# Patient Record
Sex: Male | Born: 1935 | Race: White | Hispanic: No | Smoking: Former smoker
Health system: Southern US, Community
[De-identification: ages and names within clinical notes are randomized; demographics above are authoritative.]

## PROBLEM LIST (undated history)

## (undated) DIAGNOSIS — K449 Diaphragmatic hernia without obstruction or gangrene: Secondary | ICD-10-CM

## (undated) DIAGNOSIS — M1990 Unspecified osteoarthritis, unspecified site: Secondary | ICD-10-CM

## (undated) DIAGNOSIS — G839 Paralytic syndrome, unspecified: Secondary | ICD-10-CM

## (undated) DIAGNOSIS — K4090 Unilateral inguinal hernia, without obstruction or gangrene, not specified as recurrent: Secondary | ICD-10-CM

## (undated) DIAGNOSIS — K5792 Diverticulitis of intestine, part unspecified, without perforation or abscess without bleeding: Secondary | ICD-10-CM

## (undated) DIAGNOSIS — F0390 Unspecified dementia without behavioral disturbance: Secondary | ICD-10-CM

## (undated) DIAGNOSIS — K219 Gastro-esophageal reflux disease without esophagitis: Secondary | ICD-10-CM

## (undated) DIAGNOSIS — I10 Essential (primary) hypertension: Secondary | ICD-10-CM

## (undated) DIAGNOSIS — Z8781 Personal history of (healed) traumatic fracture: Secondary | ICD-10-CM

## (undated) DIAGNOSIS — K635 Polyp of colon: Secondary | ICD-10-CM

## (undated) DIAGNOSIS — R197 Diarrhea, unspecified: Secondary | ICD-10-CM

## (undated) DIAGNOSIS — W11XXXA Fall on and from ladder, initial encounter: Secondary | ICD-10-CM

## (undated) HISTORY — DX: Diaphragmatic hernia without obstruction or gangrene: K44.9

## (undated) HISTORY — DX: Polyp of colon: K63.5

## (undated) HISTORY — DX: Fall on and from ladder, initial encounter: W11.XXXA

## (undated) HISTORY — PX: CATARACT EXTRACTION, BILATERAL: SHX1313

## (undated) HISTORY — PX: TONSILLECTOMY: SUR1361

## (undated) HISTORY — PX: COLONOSCOPY: SHX174

## (undated) HISTORY — DX: Diarrhea, unspecified: R19.7

## (undated) HISTORY — DX: Gilbert syndrome: E80.4

## (undated) HISTORY — DX: Diverticulitis of intestine, part unspecified, without perforation or abscess without bleeding: K57.92

## (undated) HISTORY — PX: CHOLECYSTECTOMY: SHX55

## (undated) HISTORY — PX: UPPER GASTROINTESTINAL ENDOSCOPY: SHX188

## (undated) HISTORY — PX: HERNIA REPAIR: SHX51

## (undated) HISTORY — PX: EYE SURGERY: SHX253

---

## 1998-09-05 ENCOUNTER — Ambulatory Visit (HOSPITAL_COMMUNITY): Admission: RE | Admit: 1998-09-05 | Discharge: 1998-09-05 | Payer: Self-pay | Admitting: Gastroenterology

## 2003-01-07 ENCOUNTER — Encounter: Payer: Self-pay | Admitting: General Surgery

## 2003-01-13 ENCOUNTER — Encounter (INDEPENDENT_AMBULATORY_CARE_PROVIDER_SITE_OTHER): Payer: Self-pay | Admitting: Specialist

## 2003-01-13 ENCOUNTER — Ambulatory Visit (HOSPITAL_COMMUNITY): Admission: RE | Admit: 2003-01-13 | Discharge: 2003-01-13 | Payer: Self-pay | Admitting: General Surgery

## 2003-02-28 ENCOUNTER — Encounter: Admission: RE | Admit: 2003-02-28 | Discharge: 2003-02-28 | Payer: Self-pay | Admitting: Internal Medicine

## 2003-02-28 ENCOUNTER — Encounter: Payer: Self-pay | Admitting: Internal Medicine

## 2004-01-15 ENCOUNTER — Inpatient Hospital Stay (HOSPITAL_COMMUNITY): Admission: AD | Admit: 2004-01-15 | Discharge: 2004-01-16 | Payer: Self-pay | Admitting: Emergency Medicine

## 2004-02-29 ENCOUNTER — Ambulatory Visit (HOSPITAL_COMMUNITY): Admission: RE | Admit: 2004-02-29 | Discharge: 2004-02-29 | Payer: Self-pay | Admitting: Gastroenterology

## 2004-08-20 ENCOUNTER — Emergency Department (HOSPITAL_COMMUNITY): Admission: EM | Admit: 2004-08-20 | Discharge: 2004-08-20 | Payer: Self-pay | Admitting: Family Medicine

## 2006-12-15 ENCOUNTER — Encounter: Admission: RE | Admit: 2006-12-15 | Discharge: 2006-12-15 | Payer: Self-pay | Admitting: *Deleted

## 2008-02-29 ENCOUNTER — Emergency Department (HOSPITAL_COMMUNITY): Admission: EM | Admit: 2008-02-29 | Discharge: 2008-02-29 | Payer: Self-pay | Admitting: Emergency Medicine

## 2010-06-05 ENCOUNTER — Inpatient Hospital Stay (HOSPITAL_COMMUNITY): Admission: EM | Admit: 2010-06-05 | Discharge: 2010-06-09 | Payer: Self-pay | Admitting: Emergency Medicine

## 2010-06-05 ENCOUNTER — Emergency Department (HOSPITAL_COMMUNITY): Admission: EM | Admit: 2010-06-05 | Discharge: 2010-06-05 | Payer: Self-pay | Admitting: Family Medicine

## 2010-12-27 LAB — POCT I-STAT, CHEM 8
BUN: 17 mg/dL (ref 6–23)
Calcium, Ion: 0.96 mmol/L — ABNORMAL LOW (ref 1.12–1.32)
Chloride: 104 mEq/L (ref 96–112)
Creatinine, Ser: 0.9 mg/dL (ref 0.4–1.5)
Glucose, Bld: 132 mg/dL — ABNORMAL HIGH (ref 70–99)
HCT: 45 % (ref 39.0–52.0)
Hemoglobin: 15.3 g/dL (ref 13.0–17.0)
Potassium: 3.6 mEq/L (ref 3.5–5.1)
Sodium: 137 mEq/L (ref 135–145)
TCO2: 24 mmol/L (ref 0–100)

## 2010-12-27 LAB — BASIC METABOLIC PANEL
BUN: 10 mg/dL (ref 6–23)
BUN: 10 mg/dL (ref 6–23)
CO2: 33 mEq/L — ABNORMAL HIGH (ref 19–32)
CO2: 33 mEq/L — ABNORMAL HIGH (ref 19–32)
Calcium: 8.8 mg/dL (ref 8.4–10.5)
Calcium: 8.8 mg/dL (ref 8.4–10.5)
Calcium: 9 mg/dL (ref 8.4–10.5)
Chloride: 100 mEq/L (ref 96–112)
Chloride: 100 mEq/L (ref 96–112)
Creatinine, Ser: 0.68 mg/dL (ref 0.4–1.5)
Creatinine, Ser: 0.7 mg/dL (ref 0.4–1.5)
GFR calc Af Amer: >60 mL/min (ref >60)
GFR calc Af Amer: >60 mL/min (ref >60)
GFR calc Af Amer: >60 mL/min (ref >60)
GFR calc non Af Amer: >60 mL/min (ref >60)
GFR calc non Af Amer: >60 mL/min (ref >60)
GFR calc non Af Amer: >60 mL/min (ref >60)
Glucose, Bld: 147 mg/dL — ABNORMAL HIGH (ref 70–99)
Glucose, Bld: 153 mg/dL — ABNORMAL HIGH (ref 70–99)
Potassium: 3.7 mEq/L (ref 3.5–5.1)
Potassium: 3.8 mEq/L (ref 3.5–5.1)
Sodium: 136 mEq/L (ref 135–145)
Sodium: 136 mEq/L (ref 135–145)
Sodium: 137 mEq/L (ref 135–145)

## 2010-12-27 LAB — CBC
HCT: 38.8 % — ABNORMAL LOW (ref 39.0–52.0)
Hemoglobin: 13.5 g/dL (ref 13.0–17.0)
Hemoglobin: 13.8 g/dL (ref 13.0–17.0)
MCH: 31.7 pg (ref 26.0–34.0)
MCHC: 34.8 g/dL (ref 30.0–36.0)
MCHC: 35.6 g/dL (ref 30.0–36.0)
MCV: 89 fL (ref 78.0–100.0)
Platelets: 92 10*3/uL — ABNORMAL LOW (ref 150–400)
Platelets: 99 10*3/uL — ABNORMAL LOW (ref 150–400)
RBC: 4.36 MIL/uL (ref 4.22–5.81)
RBC: 4.37 MIL/uL (ref 4.22–5.81)
RDW: 12.4 % (ref 11.5–15.5)
RDW: 12.5 % (ref 11.5–15.5)
WBC: 7.7 10*3/uL (ref 4.0–10.5)

## 2010-12-27 LAB — GLUCOSE, CAPILLARY
Glucose-Capillary: 139 mg/dL — ABNORMAL HIGH (ref 70–99)
Glucose-Capillary: 150 mg/dL — ABNORMAL HIGH (ref 70–99)
Glucose-Capillary: 153 mg/dL — ABNORMAL HIGH (ref 70–99)

## 2010-12-27 LAB — HEMOGLOBIN A1C
Hgb A1c MFr Bld: 5.5 % (ref <5.7)
Mean Plasma Glucose: 111 mg/dL (ref <117)

## 2010-12-27 LAB — MRSA PCR SCREENING
MRSA by PCR: NEGATIVE
MRSA by PCR: NEGATIVE

## 2011-03-01 NOTE — Op Note (Signed)
NAME:  Brent Brennan, Brent Brennan                      ACCOUNT NO.:  [id number]   MEDICAL RECORD NO.:  [id number]                   PATIENT TYPE:  AMB   LOCATION:  DAY                                  FACILITY:  [facility]   PHYSICIAN:  Timothy E. Earlene Plater, M.D.              DATE OF BIRTH:  1930-12-28   DATE OF PROCEDURE:  01/13/2003  DATE OF DISCHARGE:                                 OPERATIVE REPORT   PREOPERATIVE DIAGNOSIS:  Right inguinal hernia.   POSTOPERATIVE DIAGNOSIS:  Right direct and indirect inguinal hernia.   PROCEDURE:  Repair of hernia with mesh.   SURGEON:  Timothy E. Earlene Plater, M.D.   ANESTHESIA:  General.   CLINICAL NOTE:  The patient is otherwise healthy, thin, and is active in his  retirement.  He has an enlarging right inguinal hernia and after careful  discussion, he wishes to have it repaired.  He was evaluated by anesthesia,  identified, the permit signed.   DESCRIPTION OF PROCEDURE:  He was taken to the operating room, placed  supine, general endotracheal anesthesia administered.  The right groin had  been shaved.  It was prepped and draped in the usual fashion.  Marcaine  0.25% with epinephrine was used throughout for additional anesthesia.  A  curvilinear incision made over the palpable defect in the right groin down  to subcutaneous tissue dissected, bleeding points cauterized.  External  oblique opened in line with the fibers of the external ring and the cord  structures dissected from the floor of the canal.  A moderate distinct  direct hernia was present in the floor.  Because it was still bulging, I  closed that with a running 0 Prolene.  Also a moderate indirect sac was  identified and dissected from the cord structures back to its neck at the  internal ring and was empty.  It was ligated at its neck with a Prolene,  excess sac cut away, the neck retracted through the internal ring.  A piece  of flat mesh was cut and fashioned to fit the entire floor of the canal  and  was sewn down with running 2-0 Prolene.  This included up to and around the  internal ring.  The cord was replaced in the anatomic position.  The counts  were correct.  The external oblique was closed with running 2-0 Vicryl, deep  subcu 2-0 Vicryl, skin 3-0 Monocryl.  Final counts correct.  He tolerated it  well.  Steri-Strips and dry sterile dressing applied, and he was removed to  the recovery room in good condition.   Written and verbal instructions were given, including Percocet 5 mg #30, and  he will be followed as an outpatient.  Timothy E. Earlene Plater, M.D.    TED/MEDQ  D:  01/13/2003  T:  01/14/2003  Job:  [id number]   cc:   Darius Bump, M.D.  [address]  [place] [address]  Fax: [phone]

## 2011-03-01 NOTE — Discharge Summary (Signed)
Brent Brennan, Brent Brennan                      ACCOUNT NO.:  [id number]   MEDICAL RECORD NO.:  [id number]                   PATIENT TYPE:  INP   LOCATION:  4728                                 FACILITY:  [facility]   PHYSICIAN:  Brent Brennan, M.D.              DATE OF BIRTH:  1931/07/06   DATE OF ADMISSION:  01/15/2004  DATE OF DISCHARGE:  01/16/2004                                 DISCHARGE SUMMARY   DISCHARGE DIAGNOSES:  1. Chest pain, negative myocardial infarction.  2. Hypokalemia, resolved.  3. Slightly elevated liver function tests.  4. Remote diverticular bleed.   DISCHARGE MEDICATIONS:  1. Zetia 10 mg daily.  2. Enteric-coated aspirin 81 mg daily.  3. Protonix 40 mg 1 twice a day for two weeks, then once daily.  4. Toprol XL 25 mg daily.  5. HCTZ 25 mg daily.  6. K-Dur 20 mEq daily.   DISCHARGE INSTRUCTIONS:  1. Have blood work done Thursday.  2. No lifting over 10 pounds for three days.  3. No strenuous activity for three days, then may resume regular activities.  4. Low-salt, low-fat diet.  5. No tub baths or swimming for one week.  Okay to shower.  6. If any swelling or soreness at site, please call the office.  7. You are scheduled for Persantine Cardiolite in our office.  Do not eat or     drink after midnight the night before the test.  Wear two-piece clothing,     no cologne or deodorant.  No caffeine or milk for 48 hours prior to the     test.  8. Follow up with Dr. Clarene Brennan in the office.  The office will call you with     the date and time.   HISTORY OF PRESENT ILLNESS:  A 77 year old white male presented to [facility] ER  with chest pain after having pain at 10 a.m. at church, associated with  shortness of breath, diaphoresis, nausea, lightheadedness, no radiation of  his pain.  Nitroglycerin helped with his pain.  He was admitted to [facility] with IV heparin, IV nitroglycerin, and cardiac enzymes.  Also, his  potassium was 3.2, and that was repleted.   PAST MEDICAL HISTORY:  Denies coronary disease.  He does have hypertension,  hyperlipidemia, status post inguinal hernia repair in April 2004, and  gastroesophageal reflux disease.  Also, a diverticular bleed.  He wears a  hearing aid.  He had a cholecystectomy 10 years previously.   FAMILY HISTORY:  Negative for coronary artery disease or stroke.   SOCIAL HISTORY:  Married, two children, one grandchild.  Quit tobacco about  30 years ago.   OUTPATIENT MEDICATIONS:  Protonix, Zetia, hydrochlorothiazide, Claritin.   ALLERGIES:  No known allergies.   REVIEW OF SYSTEMS:  See History and Physical.   PHYSICAL EXAMINATION AT DISCHARGE:  VITAL SIGNS:  Blood pressure 111/67,  pulse 90, temperature 97.4, oxygen saturation 90 to 98%  on 2 liters nasal  cannula.  CHEST:  Clear.  HEART:  No murmur.  Regular rate and rhythm.  ABDOMEN:  Soft.  Positive bowel sounds.  Nontender.  EXTREMITIES:  Without edema.   LABORATORY AND X-RAY DATA:  1. Hemoglobin 16.2, hematocrit 45.7, WBC 4.4, MCV 88.1, platelets 174,     neutrophils 78, lymphs 15, monos 6.  He has 1 baso 0.  Pro time 14.7, INR     1.2.  On heparin, he was therapeutic.  2. Chemistries: Sodium 138, potassium 3.2, chloride 99, CO2 20, glucose 117,     BUN 14, creatinine 0.7, calcium 9.1, total protein 5.5, albumin 3.6, AST     63, ALT 38, alkaline phosphatase 64, total bilirubin 1.6, lipase 22.     Potassium came up to 3.4 with supplement.  3. Cardiac enzymes: CK peak was 314, MB 4.1, troponin I 0.02.  Followup CK     65, 63, MB 1.8 and 1.5.  Troponin 0.01.  4. Cholesterol 121, triglycerides 100, HDL 26, LDL 75.  5. Chest x-ray:  Elevated left hemidiaphragm, right basilar atelectasis or     infiltrate.  6. CT of the chest with contrast negative for aortic dissection.  No acute     abnormality in the chest.  Biliary duct dilatation.  No PE, no     adenopathy, no mass.  7. Please note:  The gallbladder had been removed, but the common bile  duct     was dilated at approximately 12 mm, and he did have elevated liver     function tests.  There was a question whether or not there was a biliary     structure or stone present.  8. EKG: Sinus rhythm, normal EKG.  9. Cardiac catheterization done by Dr. Elsie Brennan January 16, 2004, revealed     trivial coronary disease with a 40% RCA stenosis and 40% LAD stenosis. EF     was 60%.  There was no MR.  Dr. Elsie Brennan feel need for Cardiolite study.   HOSPITAL COURSE:  [name] was admitted January 15, 2004, with chest pain.  Cardiac enzymes initially were mildly elevated, but all troponins were  negative.  His potassium was low; that was repleted.  His LFTs were slightly  elevated, and CT of the chest showed a dilated biliary tract.  He was not  placed on a statin, Zetia only, and would follow up with Dr. Daphine Brennan for  his elevated LFTs.  Will recheck those on the Thursday after his discharge.   He was going to have a stress test done through our office, and then he  would follow up with Dr. Clarene Brennan.  His PPI was increased for two weeks, and  he was instructed on problems.  As he did have Angioseal, he would not be  able to take a bath for a week in the tub.      Brent Brennan, N.P.                     Brent Brennan, M.D.    LRI/MEDQ  D:  02/02/2004  T:  02/04/2004  Job:  [id number]   cc:   Brent Brennan, M.D.  [address]  [place] [address]  Fax: [phone]

## 2011-03-01 NOTE — Cardiovascular Report (Signed)
Brent Brennan, Brent Brennan                      ACCOUNT NO.:  [id number]   MEDICAL RECORD NO.:  [id number]                   PATIENT TYPE:  INP   LOCATION:  4728                                 FACILITY:  [facility]   PHYSICIAN:  Madaline Savage, M.D.             DATE OF BIRTH:  07-22-1931   DATE OF PROCEDURE:  01/16/2004  DATE OF DISCHARGE:  01/16/2004                              CARDIAC CATHETERIZATION   PROCEDURES PERFORMED:  1. Selective coronary angiography by Judkins technique.  2. Retrograde left heart catheterization.  3. Left ventricular angiography.  4. Successful Angioseal, right femoral arteriotomy.   COMPLICATIONS:  None.   ENTRY SITE:  Right femoral.   DYE USED:  Omnipaque.   PATIENT PROFILE:  The patient is a delightful 77 year old gentleman who  experienced chest pain while in church one day prior to admission.  It was  sharp.  A CT scan in the emergency room was negative.  His EKGs were  negative for ischemia.  His cardiac enzymes were negative for ischemia.  He  did receive nitroglycerin with two episodes of pain that provided relief.  We therefore brought him to the catheterization lab today to evaluate  coronary anatomy and because of nitrate-responsive pain.  He tolerated the  procedure well and ultimately left the catheterization lab in stable  condition.  Results are described below.   PRESSURES:  1. Left ventricular pressure was 117/8 with an end-diastolic pressure of 17.  2. The central aortic pressure was 120/65, mean of 90.  3. No aortic valve gradient by pullback technique.   ANGIOGRAPHIC RESULTS:  1. The left main coronary artery was large in caliber and normal.  2. The left anterior descending coronary artery and its first diagonal     branch were a little bit tortuous.  The LAD in the midportion of the     vessel just beyond the diagonal takeoff showed an area of 40% smooth     irregularities over about 8-10 mm of length in the vessel.  No  discrete     plaque was seen.  The diagonal looked normal.  The left circumflex was a     nondominant vessel giving rise to two posterolateral branches distally,     and no lesions were seen in the circumflex system.  3. The right coronary artery was a large, mildly tortuous vessel containing     a 40% proximal area of luminal narrowing but no discrete stenosis.  The     RCA distally bifurcated into a PDA and a PLA, both of which were normal.   Left ventricular angiography showed normal contractility, normal wall  motion, and absence of mitral regurgitation.   After a 45 degree RAO right femoral arteriogram, there was no sign of any  plaque and the puncture site appeared to be well above the bifurcation  point.  We then deployed an Angioseal with excellent results and good  hemostasis.  FINAL DIAGNOSES:  1. Trivial coronary narrowings of 40% in mid-left anterior descending and     proximal right coronary artery.  2. Normal left ventricular systolic function.  3. No evidence of reversibility of 40% lesions after intracoronary     nitroglycerin 200 mcg.   PLAN:  1. Release the patient this evening two hours following his Angioseal.  2. Follow up in the office for a stress Cardiolite.  3. Aspirin and beta blockade will be used as well as p.r.n. nitroglycerin.                                               Madaline Savage, M.D.    WHG/MEDQ  D:  01/16/2004  T:  01/17/2004  Job:  [id number]

## 2011-03-01 NOTE — H&P (Signed)
Brent Brennan, Brent Brennan                      ACCOUNT NO.:  [id number]   MEDICAL RECORD NO.:  [id number]                   PATIENT TYPE:  INP   LOCATION:  1824                                 FACILITY:  [facility]   PHYSICIAN:  Thereasa Solo. Little, M.D.              DATE OF BIRTH:  09-10-1931   DATE OF ADMISSION:  01/15/2004  DATE OF DISCHARGE:                                HISTORY & PHYSICAL   CHIEF COMPLAINT:  Chest pain, nitroglycerin responsive.   HISTORY OF PRESENT ILLNESS:  A 77 year old Caucasian gentleman without any  prior history of coronary artery disease presented this morning to the  emergency room with complaints of chest pain.  His wife drove him from  church where he experienced onset of symptoms around 10 a.m.  He described  the pain in the mid chest like a pressure with no radiation, but the patient  felt profusely diaphoretic, because short of breath, nauseated, and light-  headed.  Upon arrival to the emergency room, he was given nitroglycerin  which relieved the pain, but lowered is blood pressures to the 80s systolic,  but the patient briefly recovered to 108/68.   PAST MEDICAL HISTORY:  Unremarkable for coronary artery disease, but he does  have hypertension and hyperlipidemia and takes Zetia.  He also has history  of gastroesophageal reflux disease and is on Protonix and he is status post  right inguinal hernia repair in April 2004.   FAMILY HISTORY:  Unremarkable.  Denies stroke.  Denies coronary artery  disease.   SOCIAL HISTORY:  He is married and has two grown-up daughters in the mid-  48s, and one grandchild.  He quit smoking cigarettes approximately 30 years  ago.  Limited alcohol, occasionally.  Usually physically active, but this  winter was mostly sedentary.   REVIEW OF SYSTEMS:  Please refer to the HPI.  He also complains of a dry  cough and the patient, during the last week, experienced the symptoms of  cold or influenza and had episodes of weakness  and malaise and chills, and  also dry cough, but all of his symptoms resolved, except occasional dry  cough.  He denies any vomiting.  He denies hematuria, dysuria, melena,  hematemesis, and denies any myalgia or arthralgia.  There is no history of  thyroid disease or diabetes mellitus.   MEDICATIONS:  1. Protonix.  2. Zetia.  3. Hydrochlorothiazide.  4. Claritin.   ALLERGIES:  No known drug allergies.   PHYSICAL EXAMINATION:  VITAL SIGNS:  Blood pressure 135/75 on arrival, but  then it dropped to systolic of 80 and then recovered to 108/68.  Now, his  blood pressure is 118/70.  SO2 96% at room air.  Heart rate 76.  He is  afebrile.  HEENT:  Normocephalic, atraumatic, extraocular movements intact.  NECK:  No JVD, no carotid bruits.  LUNGS:  Clear to auscultation and percussion bilaterally.  HEART:  Regular rate and  rhythm.  No murmur, gallop, or rubs.  ABDOMEN:  Nontender, nondistended with positive bowel sounds x4.  EXTREMITIES:  Without edema.  1+ dorsalis pedis pulses bilaterally.   EKG showed sinus rhythm, no acute ST-T wave changes.  Cardiac markers, drawn  in the emergency room, are negative x1.  Hemoglobin 16.2, hematocrit 45.7,  white blood cell count 4.4, platelets 174.  Sodium 138, potassium 3.2,  chloride 99, CO2 30, BUN 14, creatinine 0.7, glucose 117.   IMPRESSION:  1. Chest pain, nitroglycerin responsive.  2. Hypokalemia.   PLAN:  We are going to admit the patient and rule out MI protocol.  Cycle  cardiac enzymes x2.  Start him on IV heparin and nitroglycerin.  At the time  of exam, the patient had recurring chest discomfort.  He may need cardiac  catheterization versus Cardiolite.  Also, we are going to have his potassium  replenished.   The medical doctor is to see the patient for further recommendations.      Raymon Mutton, P.A.                    Thereasa Solo. Little, M.D.    MK/MEDQ  D:  01/15/2004  T:  01/16/2004  Job:  [id number]

## 2013-10-14 DIAGNOSIS — Z8781 Personal history of (healed) traumatic fracture: Secondary | ICD-10-CM

## 2013-10-14 HISTORY — DX: Personal history of (healed) traumatic fracture: Z87.81

## 2016-04-05 ENCOUNTER — Other Ambulatory Visit: Payer: Self-pay | Admitting: Surgery

## 2018-10-28 ENCOUNTER — Other Ambulatory Visit: Payer: Self-pay | Admitting: General Surgery

## 2018-11-03 NOTE — Pre-Procedure Instructions (Addendum)
Cecilie Kicksubrey L Segoviano  11/03/2018      Your procedure is scheduled on November 09, 2018.  [facility] Admitting at 05:30 A.M.  Call this number if you have problems the morning of surgery:  [phone]   Remember:  Do not eat after midnight. You may drink clear liquids until 04:30 AM .  Clear liquids allowed are: Black Coffee, Water, Clear Juice, Gatorade.      Take these medicines the morning of surgery with A SIP OF WATER : Memantine (Namenda) Omeprazole (Prilosec)   7 days prior to surgery STOP taking any Aspirin (unless otherwise instructed by your surgeon), Aleve, Naproxen, Ibuprofen, Motrin, Advil, Goody's, BC's, all herbal medications, fish oil, and all vitamins.   Do not wear jewelry.  Do not wear lotions, powders, or perfumes, or deodorant.  Do not shave 48 hours prior to surgery.  Men may shave face and neck.  Do not bring valuables to the hospital.  [facility] is [facility] any belongings or valuables.  Contacts, dentures or bridgework may not be worn into surgery.  Leave your suitcase in the car.  After surgery it may be brought to your room.  For patients admitted to the hospital, discharge time will be determined by your treatment team.  Patients discharged the day of surgery will not be allowed to drive home.   Special instructions:   [facility]- Preparing For Surgery  Before surgery, you can play an important role. Because skin is not sterile, your skin needs to be as free of germs as possible. You can reduce the number of germs on your skin by washing with CHG (chlorahexidine gluconate) Soap before surgery.  CHG is an antiseptic cleaner which kills germs and bonds with the skin to continue killing germs even after washing.    Oral Hygiene is also important to reduce your risk of infection.  Remember - BRUSH YOUR TEETH THE MORNING OF SURGERY WITH YOUR REGULAR TOOTHPASTE  Please do not use if you have an allergy to CHG or  antibacterial soaps. If your skin becomes reddened/irritated stop using the CHG.  Do not shave (including legs and underarms) for at least 48 hours prior to first CHG shower. It is OK to shave your face.  Please follow these instructions carefully.   1. Shower the NIGHT BEFORE SURGERY and the MORNING OF SURGERY with CHG.   2. If you chose to wash your hair, wash your hair first as usual with your normal shampoo.  3. After you shampoo, rinse your hair and body thoroughly to remove the shampoo.  4. Use CHG as you would any other liquid soap. You can apply CHG directly to the skin and wash gently with a scrungie or a clean washcloth.   5. Apply the CHG Soap to your body ONLY FROM THE NECK DOWN.  Do not use on open wounds or open sores. Avoid contact with your eyes, ears, mouth and genitals (private parts). Wash Face and genitals (private parts)  with your normal soap.  6. Wash thoroughly, paying special attention to the area where your surgery will be performed.  7. Thoroughly rinse your body with warm water from the neck down.  8. DO NOT shower/wash with your normal soap after using and rinsing off the CHG Soap.  9. Pat yourself dry with a CLEAN TOWEL.  10. Wear CLEAN PAJAMAS to bed the night before surgery, wear comfortable clothes the morning of surgery  11. Place CLEAN  SHEETS on your bed the night of your first shower and DO NOT SLEEP WITH PETS.    Day of Surgery:  Do not apply any deodorants/lotions.  Please wear clean clothes to the hospital/surgery center.   Remember to brush your teeth WITH YOUR REGULAR TOOTHPASTE.    Please read over the following fact sheets that you were given.

## 2018-11-04 ENCOUNTER — Encounter (HOSPITAL_COMMUNITY)
Admission: RE | Admit: 2018-11-04 | Discharge: 2018-11-04 | Disposition: A | Discharge status: Home / Self Care | Payer: Medicare Other | Source: Ambulatory Visit | Attending: General Surgery | Admitting: General Surgery

## 2018-11-04 ENCOUNTER — Other Ambulatory Visit: Payer: Self-pay

## 2018-11-04 ENCOUNTER — Encounter (HOSPITAL_COMMUNITY): Payer: Self-pay

## 2018-11-04 DIAGNOSIS — Z01818 Encounter for other preprocedural examination: Secondary | ICD-10-CM | POA: Diagnosis not present

## 2018-11-04 DIAGNOSIS — I10 Essential (primary) hypertension: Secondary | ICD-10-CM | POA: Insufficient documentation

## 2018-11-04 HISTORY — DX: Gastro-esophageal reflux disease without esophagitis: K21.9

## 2018-11-04 HISTORY — DX: Unspecified osteoarthritis, unspecified site: M19.90

## 2018-11-04 HISTORY — DX: Personal history of (healed) traumatic fracture: Z87.81

## 2018-11-04 HISTORY — DX: Unspecified dementia, unspecified severity, without behavioral disturbance, psychotic disturbance, mood disturbance, and anxiety: F03.90

## 2018-11-04 HISTORY — DX: Essential (primary) hypertension: I10

## 2018-11-04 LAB — COMPREHENSIVE METABOLIC PANEL
ALT: 14 U/L (ref 0–44)
AST: 22 U/L (ref 15–41)
Albumin: 3.8 g/dL (ref 3.5–5.0)
Alkaline Phosphatase: 40 U/L (ref 38–126)
Anion gap: 9 (ref 5–15)
BUN: 10 mg/dL (ref 8–23)
CALCIUM: 9.6 mg/dL (ref 8.9–10.3)
CO2: 27 mmol/L (ref 22–32)
CREATININE: 0.74 mg/dL (ref 0.61–1.24)
Chloride: 101 mmol/L (ref 98–111)
GFR calc Af Amer: >60 mL/min (ref >60)
GFR calc non Af Amer: >60 mL/min (ref >60)
Glucose, Bld: 214 mg/dL — ABNORMAL HIGH (ref 70–99)
Potassium: 3.8 mmol/L (ref 3.5–5.1)
Sodium: 137 mmol/L (ref 135–145)
Total Bilirubin: 0.9 mg/dL (ref 0.3–1.2)
Total Protein: 6.1 g/dL — ABNORMAL LOW (ref 6.5–8.1)

## 2018-11-04 LAB — CBC WITH DIFFERENTIAL/PLATELET
ABS IMMATURE GRANULOCYTES: 0.01 10*3/uL (ref 0.00–0.07)
BASOS ABS: 0 10*3/uL (ref 0.0–0.1)
BASOS PCT: 0 %
EOS ABS: 0.1 10*3/uL (ref 0.0–0.5)
Eosinophils Relative: 3 %
HEMATOCRIT: 43.7 % (ref 39.0–52.0)
Hemoglobin: 14.1 g/dL (ref 13.0–17.0)
Immature Granulocytes: 0 %
LYMPHS ABS: 1 10*3/uL (ref 0.7–4.0)
Lymphocytes Relative: 20 %
MCH: 30.5 pg (ref 26.0–34.0)
MCHC: 32.3 g/dL (ref 30.0–36.0)
MCV: 94.4 fL (ref 80.0–100.0)
MONOS PCT: 7 %
Monocytes Absolute: 0.3 10*3/uL (ref 0.1–1.0)
NEUTROS ABS: 3.4 10*3/uL (ref 1.7–7.7)
NEUTROS PCT: 70 %
NRBC: 0 % (ref 0.0–0.2)
PLATELETS: 139 10*3/uL — AB (ref 150–400)
RBC: 4.63 MIL/uL (ref 4.22–5.81)
RDW: 12.1 % (ref 11.5–15.5)
WBC: 4.9 10*3/uL (ref 4.0–10.5)

## 2018-11-04 NOTE — Progress Notes (Addendum)
PCP: Dr. Felipa EthAvva Cardiologist: Denies  EKG: Today CXR: n/a ECHO: denies Stress Test: denies Cardiac Cath: denies  Pt with history of short term memory loss/dementia.  Pt A&O x4 at appt--person, place, time, situation.  Good historian of medical history. Daughter accompanied patient at visit.   Daughter stated she would follow up with Dr. Felipa EthAvva regarding ASA.   Patient denies shortness of breath, fever, cough, and chest pain at PAT appointment.  Patient verbalized understanding of instructions provided today at the PAT appointment.  Patient asked to review instructions at home and day of surgery.   Verbalized understanding of when to stop clear liquids morning of surgery.

## 2018-11-04 NOTE — Progress Notes (Addendum)
Glucose 214.  Hgb A1C ordered as add-on to previously collected specimen as patient already left appt when labs resulted.  Chart forwarded to anesthesia.

## 2018-11-05 LAB — HEMOGLOBIN A1C
HEMOGLOBIN A1C: 5.4 % (ref 4.8–5.6)
Mean Plasma Glucose: 108.28 mg/dL

## 2018-11-07 NOTE — H&P (Signed)
Cecilie KicksAubrey L Pollard [facility] Patient #: [id number] DOB: 02/07/1931 Married / Language: English / Race: White Male      History of Present Illness       This is a very pleasant, healthy 85 year old man, referred by Dr. Felipa EthAvva for a large left inguinal hernia.       The patient has no prior history of hernia surgery that he can remember. He says he's had a bulge in his left groin for one month. Maybe is getting a little larger. He doesn't have any pain. It is very large.     Comorbidities include urinary frequency. Recent URI. Hypertension. Has been fairly healthy. Socially he lives with his wife who is disabled and uses a walker. He has to lift her a lot. Daughter-in-law is with him throughout the Day.       On exam he has a very large left inguinal hernia. Reducible when supine. No evidence of hernia on the right. Likely this involves the GI tract due to his size. He is at risk for incarceration Despite his age advise elective repair and he agrees He'll be scheduled for open repair of his left inguinal hernia with mesh. I discussed the indications, details, techniques, and numerous risk of the surgery with him and his daughter-in-law. He is aware the risk of bleeding, infection, recurrence, nerve damage with chronic pain, injury to the testicle or bladder or intestine with major reconstructive surgery, although unlikely. He understands all these issues. All of his questions were answered. He agrees with this plan.   Past Surgical History  Cataract Surgery  Bilateral. Colon Polyp Removal - Colonoscopy   Allergies  No Known Drug Allergies  Allergies Reconciled   Medication History  hydroCHLOROthiazide (25MG  Tablet, Oral) Active. traMADol HCl (50MG  Tablet, Oral) Active. Memantine HCl (10MG  Tablet, Oral) Active. Omeprazole (20MG  Capsule DR, Oral) Active. Calcium 600 (1500 (600 Ca)MG Tablet, Oral) Active. Multi Vitamin (Oral)  Active. Vitamin D (1000UNIT Tablet, Oral) Active. Vitamin C (100MG  Tablet, Oral) Active. Potassium (Oral) Specific strength unknown - Active. Aspirin (81MG  Tablet DR, Oral) Active. PreserVision AREDS (Oral) Active. Medications Reconciled  Social History  Caffeine use  Coffee, Tea. Tobacco use  Former smoker.  Family History  Arthritis  Mother.  Other Problems  Arthritis  Gastroesophageal Reflux Disease     Review of Systems  General Not Present- Appetite Loss, Chills, Fatigue, Fever, Night Sweats, Weight Gain and Weight Loss. Skin Not Present- Change in Wart/Mole, Dryness, Hives, Jaundice, New Lesions, Non-Healing Wounds, Rash and Ulcer. HEENT Present- Hearing Loss. Not Present- Earache, Hoarseness, Nose Bleed, Oral Ulcers, Ringing in the Ears, Seasonal Allergies, Sinus Pain, Sore Throat, Visual Disturbances, Wears glasses/contact lenses and Yellow Eyes. Respiratory Not Present- Bloody sputum, Chronic Cough, Difficulty Breathing, Snoring and Wheezing. Breast Not Present- Breast Mass, Breast Pain, Nipple Discharge and Skin Changes. Cardiovascular Not Present- Chest Pain, Difficulty Breathing Lying Down, Leg Cramps, Palpitations, Rapid Heart Rate, Shortness of Breath and Swelling of Extremities. Gastrointestinal Not Present- Abdominal Pain, Bloating, Bloody Stool, Change in Bowel Habits, Chronic diarrhea, Constipation, Difficulty Swallowing, Excessive gas, Gets full quickly at meals, Hemorrhoids, Indigestion, Nausea, Rectal Pain and Vomiting. Male Genitourinary Not Present- Blood in Urine, Change in Urinary Stream, Frequency, Impotence, Nocturia, Painful Urination, Urgency and Urine Leakage. Musculoskeletal Not Present- Back Pain, Joint Pain, Joint Stiffness, Muscle Pain, Muscle Weakness and Swelling of Extremities. Neurological Present- Decreased Memory. Not Present- Fainting, Headaches, Numbness, Seizures, Tingling, Tremor, Trouble walking and Weakness. Psychiatric Not  Present-  Anxiety, Bipolar, Change in Sleep Pattern, Depression, Fearful and Frequent crying. Endocrine Not Present- Cold Intolerance, Excessive Hunger, Hair Changes, Heat Intolerance, Hot flashes and New Diabetes. Hematology Present- Easy Bruising. Not Present- Blood Thinners, Excessive bleeding, Gland problems, HIV and Persistent Infections.  Vitals  Weight: 141.6 lb Height: 68in Body Surface Area: 1.76 m Body Mass Index: 21.53 kg/m  Temp.: 96.99F(Temporal)  Pulse: 76 (Regular)  P.OX: 97% (Room air) BP: 130/72 (Sitting, Right Arm, Standard)       Physical Exam  General Mental Status-Alert. General Appearance-Consistent with stated age. Hydration-Well hydrated. Voice-Normal.  Head and Neck Head-normocephalic, atraumatic with no lesions or palpable masses. Trachea-midline. Thyroid Gland Characteristics - normal size and consistency.  Eye Eyeball - Bilateral-Extraocular movements intact. Sclera/Conjunctiva - Bilateral-No scleral icterus.  Chest and Lung Exam Chest and lung exam reveals -quiet, even and easy respiratory effort with no use of accessory muscles and on auscultation, normal breath sounds, no adventitious sounds and normal vocal resonance. Inspection Chest Wall - Normal. Back - normal.  Cardiovascular Cardiovascular examination reveals -normal heart sounds, regular rate and rhythm with no murmurs and normal pedal pulses bilaterally.  Abdomen Inspection Inspection of the abdomen reveals - No Hernias. Skin - Scar - no surgical scars. Palpation/Percussion Palpation and Percussion of the abdomen reveal - Soft, Non Tender, No Rebound tenderness, No Rigidity (guarding) and No hepatosplenomegaly. Auscultation Auscultation of the abdomen reveals - Bowel sounds normal.  Male Genitourinary Note: Large left inguinal hernia. Size of a small grapefruit on standing. Reducible when supine. I do not feel a hernia on the right side. No  scrotal mass. I used a light and inspected carefully but I do not see any evidence of scars in the inguinal area through his hair. Umbilicus normal.   Neurologic Neurologic evaluation reveals -alert and oriented x 3 with no impairment of recent or remote memory. Mental Status-Normal.  Neuropsychiatric Note: Alert. Oriented 4. Excellent insight. Normal mental status. Appears very fit for his age.   Musculoskeletal Normal Exam - Left-Upper Extremity Strength Normal and Lower Extremity Strength Normal. Normal Exam - Right-Upper Extremity Strength Normal and Lower Extremity Strength Normal.  Lymphatic Head & Neck  General Head & Neck Lymphatics: Bilateral - Description - Normal. Axillary  General Axillary Region: Bilateral - Description - Normal. Tenderness - Non Tender. Femoral & Inguinal  Generalized Femoral & Inguinal Lymphatics: Bilateral - Description - Normal. Tenderness - Non Tender.    Assessment & Plan  LEFT INGUINAL HERNIA (K40.90)   You have a very large left inguinal hernia I do not feel any hernia elsewhere I do not see any scars in the area from previous surgery Fortunately I can push the hernia back in Because of his large size, you are at increased risk for incarceration or strangulation which is dangerous  I recommended elective repair of your left inguinal hernia with mesh I have discussed the indications, techniques, and risk of the surgery in detail with you and your daughter I have discussed the temporary disabilities. You cannot lift your wife for for 5 weeks. No heavy lifting for 5 weeks. You agree with this plan  HYPERTENSION, ESSENTIAL (I10)    Angelia MouldHaywood M. Derrell LollingIngram, M.D., [facility] General and Minimally invasive Surgery Breast and Colorectal Surgery Office:   [phone] Pager:   [phone]

## 2018-11-08 NOTE — Anesthesia Preprocedure Evaluation (Addendum)
Anesthesia Evaluation  Patient identified by MRN, date of birth, ID band Patient awake    Reviewed: Allergy & Precautions, NPO status , Patient's Chart, lab work & pertinent test results  Airway Mallampati: II  TM Distance: >3 FB     Dental   Pulmonary former smoker,    breath sounds clear to auscultation       Cardiovascular hypertension,  Rhythm:Regular Rate:Normal     Neuro/Psych    GI/Hepatic Neg liver ROS, GERD  ,  Endo/Other  negative endocrine ROS  Renal/GU negative Renal ROS     Musculoskeletal   Abdominal   Peds  Hematology   Anesthesia Other Findings   Reproductive/Obstetrics                            Anesthesia Physical Anesthesia Plan  ASA: III  Anesthesia Plan: General   Post-op Pain Management:  Regional for Post-op pain   Induction: Intravenous  PONV Risk Score and Plan: Ondansetron, Dexamethasone and Midazolam  Airway Management Planned: Oral ETT  Additional Equipment:   Intra-op Plan:   Post-operative Plan: Extubation in OR  Informed Consent: I have reviewed the patients History and Physical, chart, labs and discussed the procedure including the risks, benefits and alternatives for the proposed anesthesia with the patient or authorized representative who has indicated his/her understanding and acceptance.     Dental advisory given  Plan Discussed with: Anesthesiologist, CRNA and Surgeon  Anesthesia Plan Comments:        Anesthesia Quick Evaluation

## 2018-11-09 ENCOUNTER — Ambulatory Visit (HOSPITAL_COMMUNITY): Payer: Medicare Other | Admitting: Anesthesiology

## 2018-11-09 ENCOUNTER — Encounter (HOSPITAL_COMMUNITY)
Admission: RE | Disposition: A | Discharge status: Home / Self Care | Payer: Self-pay | Source: Home / Self Care | Attending: General Surgery

## 2018-11-09 ENCOUNTER — Ambulatory Visit (HOSPITAL_COMMUNITY)
Admission: RE | Admit: 2018-11-09 | Discharge: 2018-11-09 | Disposition: A | Discharge status: Home / Self Care | Payer: Medicare Other | Attending: General Surgery | Admitting: General Surgery

## 2018-11-09 ENCOUNTER — Encounter (HOSPITAL_COMMUNITY): Payer: Self-pay | Admitting: *Deleted

## 2018-11-09 ENCOUNTER — Ambulatory Visit (HOSPITAL_COMMUNITY): Payer: Medicare Other | Admitting: Physician Assistant

## 2018-11-09 DIAGNOSIS — I10 Essential (primary) hypertension: Secondary | ICD-10-CM | POA: Diagnosis not present

## 2018-11-09 DIAGNOSIS — Z79899 Other long term (current) drug therapy: Secondary | ICD-10-CM | POA: Diagnosis not present

## 2018-11-09 DIAGNOSIS — Z7982 Long term (current) use of aspirin: Secondary | ICD-10-CM | POA: Insufficient documentation

## 2018-11-09 DIAGNOSIS — Z87891 Personal history of nicotine dependence: Secondary | ICD-10-CM | POA: Insufficient documentation

## 2018-11-09 DIAGNOSIS — R350 Frequency of micturition: Secondary | ICD-10-CM | POA: Diagnosis not present

## 2018-11-09 DIAGNOSIS — K4090 Unilateral inguinal hernia, without obstruction or gangrene, not specified as recurrent: Secondary | ICD-10-CM

## 2018-11-09 HISTORY — DX: Unilateral inguinal hernia, without obstruction or gangrene, not specified as recurrent: K40.90

## 2018-11-09 HISTORY — PX: INGUINAL HERNIA REPAIR: SHX194

## 2018-11-09 SURGERY — REPAIR, HERNIA, INGUINAL, ADULT
Anesthesia: General | Laterality: Left

## 2018-11-09 MED ORDER — BUPIVACAINE-EPINEPHRINE 0.25% -1:200000 IJ SOLN
INTRAMUSCULAR | Status: AC
  Filled 2018-11-09: qty 1

## 2018-11-09 MED ORDER — CHLORHEXIDINE GLUCONATE CLOTH 2 % EX PADS: 6.0000 | MEDICATED_PAD | Freq: Once | CUTANEOUS | Status: DC

## 2018-11-09 MED ORDER — LIDOCAINE 2% (20 MG/ML) 5 ML SYRINGE
INTRAMUSCULAR | Status: AC
  Filled 2018-11-09: qty 5

## 2018-11-09 MED ORDER — LACTATED RINGERS IV SOLN
INTRAVENOUS | Status: DC | PRN
  Administered 2018-11-09: 07:00:00 via INTRAVENOUS

## 2018-11-09 MED ORDER — PROPOFOL 10 MG/ML IV BOLUS
INTRAVENOUS | Status: AC
  Filled 2018-11-09: qty 20

## 2018-11-09 MED ORDER — PROPOFOL 10 MG/ML IV BOLUS
INTRAVENOUS | Status: DC | PRN
  Administered 2018-11-09: 100 mg via INTRAVENOUS

## 2018-11-09 MED ORDER — SODIUM CHLORIDE 0.9 % IV SOLN: 250.0000 mL | INTRAVENOUS | Status: DC | PRN

## 2018-11-09 MED ORDER — CEFAZOLIN SODIUM-DEXTROSE 2-4 GM/100ML-% IV SOLN
2.0000 g | INTRAVENOUS | Status: AC
  Administered 2018-11-09: 2 g via INTRAVENOUS

## 2018-11-09 MED ORDER — ROCURONIUM BROMIDE 50 MG/5ML IV SOSY
PREFILLED_SYRINGE | INTRAVENOUS | Status: DC | PRN
  Administered 2018-11-09: 50 mg via INTRAVENOUS

## 2018-11-09 MED ORDER — SODIUM CHLORIDE 0.9% FLUSH: 3.0000 mL | INTRAVENOUS | Status: DC | PRN

## 2018-11-09 MED ORDER — LACTATED RINGERS IV SOLN: INTRAVENOUS | Status: DC

## 2018-11-09 MED ORDER — OXYCODONE HCL 5 MG PO TABS
ORAL_TABLET | ORAL | Status: AC
  Filled 2018-11-09: qty 1

## 2018-11-09 MED ORDER — FENTANYL CITRATE (PF) 100 MCG/2ML IJ SOLN
25.0000 ug | INTRAMUSCULAR | Status: DC | PRN
Start: 2018-11-09 — End: 2018-11-09

## 2018-11-09 MED ORDER — TRAMADOL HCL 50 MG PO TABS: 50.0000 mg | ORAL_TABLET | Freq: Four times a day (QID) | ORAL | 0 refills | Status: DC | PRN

## 2018-11-09 MED ORDER — ACETAMINOPHEN 500 MG PO TABS: 1000.0000 mg | ORAL_TABLET | Freq: Four times a day (QID) | ORAL | Status: DC

## 2018-11-09 MED ORDER — SODIUM CHLORIDE 0.9% FLUSH: 3.0000 mL | Freq: Two times a day (BID) | INTRAVENOUS | Status: DC

## 2018-11-09 MED ORDER — CEFAZOLIN SODIUM-DEXTROSE 2-4 GM/100ML-% IV SOLN
INTRAVENOUS | Status: AC
  Filled 2018-11-09: qty 100

## 2018-11-09 MED ORDER — 0.9 % SODIUM CHLORIDE (POUR BTL) OPTIME
TOPICAL | Status: DC | PRN
  Administered 2018-11-09: 1000 mL

## 2018-11-09 MED ORDER — ACETAMINOPHEN 500 MG PO TABS: 1000.0000 mg | ORAL_TABLET | ORAL | Status: DC

## 2018-11-09 MED ORDER — BUPIVACAINE-EPINEPHRINE 0.25% -1:200000 IJ SOLN
INTRAMUSCULAR | Status: DC | PRN
  Administered 2018-11-09: 10 mL

## 2018-11-09 MED ORDER — GABAPENTIN 300 MG PO CAPS: 300.0000 mg | ORAL_CAPSULE | ORAL | Status: DC

## 2018-11-09 MED ORDER — ACETAMINOPHEN 500 MG PO TABS
ORAL_TABLET | ORAL | Status: AC
  Administered 2018-11-09: 1000 mg
  Filled 2018-11-09: qty 2

## 2018-11-09 MED ORDER — SUGAMMADEX SODIUM 200 MG/2ML IV SOLN
INTRAVENOUS | Status: DC | PRN
  Administered 2018-11-09: 130 mg via INTRAVENOUS

## 2018-11-09 MED ORDER — PHENYLEPHRINE 40 MCG/ML (10ML) SYRINGE FOR IV PUSH (FOR BLOOD PRESSURE SUPPORT)
PREFILLED_SYRINGE | INTRAVENOUS | Status: AC
  Filled 2018-11-09: qty 20

## 2018-11-09 MED ORDER — GABAPENTIN 300 MG PO CAPS
ORAL_CAPSULE | ORAL | Status: AC
  Administered 2018-11-09: 300 mg
  Filled 2018-11-09: qty 1

## 2018-11-09 MED ORDER — ONDANSETRON HCL 4 MG/2ML IJ SOLN
INTRAMUSCULAR | Status: AC
  Filled 2018-11-09: qty 2

## 2018-11-09 MED ORDER — DEXAMETHASONE SODIUM PHOSPHATE 10 MG/ML IJ SOLN
INTRAMUSCULAR | Status: AC
  Filled 2018-11-09: qty 1

## 2018-11-09 MED ORDER — ACETAMINOPHEN 325 MG PO TABS: 650.0000 mg | ORAL_TABLET | ORAL | Status: DC | PRN

## 2018-11-09 MED ORDER — PHENYLEPHRINE 40 MCG/ML (10ML) SYRINGE FOR IV PUSH (FOR BLOOD PRESSURE SUPPORT)
PREFILLED_SYRINGE | INTRAVENOUS | Status: DC | PRN
  Administered 2018-11-09 (×2): 120 ug via INTRAVENOUS

## 2018-11-09 MED ORDER — FENTANYL CITRATE (PF) 100 MCG/2ML IJ SOLN: 25.0000 ug | INTRAMUSCULAR | Status: DC | PRN

## 2018-11-09 MED ORDER — ACETAMINOPHEN 650 MG RE SUPP: 650.0000 mg | RECTAL | Status: DC | PRN

## 2018-11-09 MED ORDER — ONDANSETRON HCL 4 MG/2ML IJ SOLN
INTRAMUSCULAR | Status: DC | PRN
  Administered 2018-11-09: 4 mg via INTRAVENOUS

## 2018-11-09 MED ORDER — FENTANYL CITRATE (PF) 250 MCG/5ML IJ SOLN
INTRAMUSCULAR | Status: AC
  Filled 2018-11-09: qty 5

## 2018-11-09 MED ORDER — LIDOCAINE 2% (20 MG/ML) 5 ML SYRINGE
INTRAMUSCULAR | Status: DC | PRN
  Administered 2018-11-09: 60 mg via INTRAVENOUS

## 2018-11-09 MED ORDER — OXYCODONE HCL 5 MG PO TABS
5.0000 mg | ORAL_TABLET | ORAL | Status: DC | PRN
  Administered 2018-11-09: 5 mg via ORAL

## 2018-11-09 MED ORDER — FENTANYL CITRATE (PF) 100 MCG/2ML IJ SOLN
INTRAMUSCULAR | Status: DC | PRN
  Administered 2018-11-09 (×2): 50 ug via INTRAVENOUS

## 2018-11-09 MED ORDER — DEXAMETHASONE SODIUM PHOSPHATE 10 MG/ML IJ SOLN
INTRAMUSCULAR | Status: DC | PRN
  Administered 2018-11-09: 10 mg via INTRAVENOUS

## 2018-11-09 SURGICAL SUPPLY — 42 items
ADH SKN CLS APL DERMABOND .7 (GAUZE/BANDAGES/DRESSINGS) ×1
BLADE CLIPPER SURG (BLADE) ×3 IMPLANT
CANISTER SUCT 3000ML PPV (MISCELLANEOUS) ×3 IMPLANT
CHLORAPREP W/TINT 26ML (MISCELLANEOUS) ×3 IMPLANT
COVER SURGICAL LIGHT HANDLE (MISCELLANEOUS) ×3 IMPLANT
COVER WAND RF STERILE (DRAPES) ×1 IMPLANT
DERMABOND ADVANCED (GAUZE/BANDAGES/DRESSINGS) ×2
DERMABOND ADVANCED .7 DNX12 (GAUZE/BANDAGES/DRESSINGS) ×1 IMPLANT
DRAIN PENROSE 1/2X12 LTX STRL (WOUND CARE) ×3 IMPLANT
DRAPE LAPAROTOMY TRNSV 102X78 (DRAPE) ×3 IMPLANT
ELECT REM PT RETURN 9FT ADLT (ELECTROSURGICAL) ×3
ELECTRODE REM PT RTRN 9FT ADLT (ELECTROSURGICAL) ×1 IMPLANT
GLOVE ECLIPSE 6.5 STRL STRAW (GLOVE) ×2 IMPLANT
GLOVE EUDERMIC 7 POWDERFREE (GLOVE) ×3 IMPLANT
GLOVE INDICATOR 7.0 STRL GRN (GLOVE) ×4 IMPLANT
GOWN STRL REUS W/ TWL LRG LVL3 (GOWN DISPOSABLE) ×1 IMPLANT
GOWN STRL REUS W/ TWL XL LVL3 (GOWN DISPOSABLE) ×1 IMPLANT
GOWN STRL REUS W/TWL LRG LVL3 (GOWN DISPOSABLE) ×3
GOWN STRL REUS W/TWL XL LVL3 (GOWN DISPOSABLE) ×3
KIT BASIN OR (CUSTOM PROCEDURE TRAY) ×3 IMPLANT
KIT TURNOVER KIT B (KITS) ×3 IMPLANT
MESH ULTRAPRO 3X6 7.6X15CM (Mesh General) ×2 IMPLANT
NDL HYPO 25GX1X1/2 BEV (NEEDLE) ×1 IMPLANT
NEEDLE HYPO 25GX1X1/2 BEV (NEEDLE) ×3 IMPLANT
NS IRRIG 1000ML POUR BTL (IV SOLUTION) ×3 IMPLANT
PACK GENERAL/GYN (CUSTOM PROCEDURE TRAY) ×3 IMPLANT
PAD ARMBOARD 7.5X6 YLW CONV (MISCELLANEOUS) ×3 IMPLANT
PENCIL SMOKE EVACUATOR (MISCELLANEOUS) ×3 IMPLANT
SPONGE INTESTINAL PEANUT (DISPOSABLE) ×3 IMPLANT
SUT MNCRL AB 4-0 PS2 18 (SUTURE) ×3 IMPLANT
SUT PROLENE 2 0 CT2 30 (SUTURE) ×11 IMPLANT
SUT SILK 2 0 (SUTURE) ×3
SUT SILK 2 0 SH (SUTURE) IMPLANT
SUT SILK 2-0 18XBRD TIE 12 (SUTURE) ×1 IMPLANT
SUT VIC AB 2-0 BRD 54 (SUTURE) ×3 IMPLANT
SUT VIC AB 2-0 CT1 27 (SUTURE) ×6
SUT VIC AB 2-0 CT1 TAPERPNT 27 (SUTURE) ×2 IMPLANT
SUT VIC AB 3-0 SH 27 (SUTURE) ×3
SUT VIC AB 3-0 SH 27XBRD (SUTURE) ×1 IMPLANT
SYR CONTROL 10ML LL (SYRINGE) ×3 IMPLANT
TOWEL OR 17X24 6PK STRL BLUE (TOWEL DISPOSABLE) ×3 IMPLANT
TOWEL OR 17X26 10 PK STRL BLUE (TOWEL DISPOSABLE) ×3 IMPLANT

## 2018-11-09 NOTE — Anesthesia Postprocedure Evaluation (Signed)
Anesthesia Post Note  Patient: Brent Brennan  Procedure(s) Performed: OPEN REPAIR LEFT INGUINAL HERNIA WITH MESH ERAS PATHWAY (Left )     Patient location during evaluation: PACU Anesthesia Type: General Level of consciousness: awake Pain management: pain level controlled Vital Signs Assessment: post-procedure vital signs reviewed and stable Respiratory status: spontaneous breathing Cardiovascular status: stable Postop Assessment: no apparent nausea or vomiting Anesthetic complications: no    Last Vitals:  Vitals:   11/09/18 1015 11/09/18 1018  BP:  (!) 120/93  Pulse: 65 66  Resp: 17 16  Temp: 36.7 C   SpO2: 97% 96%    Last Pain:  Vitals:   11/09/18 0849  TempSrc:   PainSc: Asleep                 Chau Sawin

## 2018-11-09 NOTE — Transfer of Care (Signed)
Immediate Anesthesia Transfer of Care Note  Patient: Brent Brennan  Procedure(s) Performed: OPEN REPAIR LEFT INGUINAL HERNIA WITH MESH ERAS PATHWAY (Left )  Patient Location: PACU  Anesthesia Type:General  Level of Consciousness: awake and patient cooperative  Airway & Oxygen Therapy: Patient Spontanous Breathing and Patient connected to face mask oxygen  Post-op Assessment: Report given to RN and Post -op Vital signs reviewed and stable  Post vital signs: Reviewed and stable  Last Vitals:  Vitals Value Taken Time  BP 182/72 11/09/2018  8:48 AM  Temp    Pulse 62 11/09/2018  8:49 AM  Resp 13 11/09/2018  8:49 AM  SpO2 100 % 11/09/2018  8:49 AM  Vitals shown include unvalidated device data.  Last Pain:  Vitals:   11/09/18 0628  TempSrc:   PainSc: 0-No pain         Complications: No apparent anesthesia complications

## 2018-11-09 NOTE — Anesthesia Procedure Notes (Signed)
Anesthesia Regional Block: TAP block   Pre-Anesthetic Checklist: ,, timeout performed, Correct Patient, Correct Site, Correct Laterality, Correct Procedure, Correct Position, site marked, Risks and benefits discussed,  Surgical consent,  Pre-op evaluation,  At surgeon's request and post-op pain management  Laterality: Left  Prep: chloraprep       Needles:   Needle Type: Echogenic Stimulator Needle          Additional Needles:   Procedures: Doppler guided,,,, ultrasound used (permanent image in chart),,,,  Narrative:  Start time: 11/09/2018 7:00 AM End time: 11/09/2018 7:05 AM Injection made incrementally with aspirations every 5 mL.  Performed by: Personally  Anesthesiologist: Dorris SinghGreen, Brent Frees, MD

## 2018-11-09 NOTE — Op Note (Signed)
Patient Name:           Brent Brennan   Date of Surgery:        11/09/2018  Pre op Diagnosis:      Left inguinal hernia  Post op Diagnosis:    Left inguinal hernia  Procedure:                 Open repair left inguinal hernia with mesh Armanda Heritage repair)  Surgeon:                     Angelia Mould. Derrell Lolling, M.D., FACS  Assistant:                      OR staff  Operative Indications:   This is a very pleasant, healthy 85 year old man, referred by Dr. Felipa Eth for a large left inguinal hernia.       The patient has no prior history of hernia surgery that he can remember. He says he's had a bulge in his left groin for one month.  getting a little larger. He doesn't have any pain. It is very large.     Comorbidities include urinary frequency. Recent URI. Hypertension. Has been fairly healthy.       On exam he has a very large left inguinal hernia. Reducible when supine. No evidence of hernia on the right. He'll be scheduled for open repair of his left inguinal hernia with mesh.  Operative Findings:       The hernia was reducible.  I found a large indirect hernia sac but there were no incarcerated contents.  Palpation through the sac revealed no evidence of femoral hernia.  Femoral artery was soft.  The floor of the inguinal canal was weak but was really not bulging.  Procedure in Detail:          TA PP block was performed by anesthesia.  The patient underwent general and endotracheal anesthesia.  The abdomen and genitalia were prepped and draped in a sterile fashion.  Intravenous antibiotics were given.  Surgical timeout was performed.  0.25% Marcaine with epinephrine was used to supplement the local anesthesia in the skin and subcutaneous tissue.     A transverse incision was made in the left groin overlying the inguinal canal.  Dissection was carried down to the external oblique.  The external oblique was incised in the direction of its fibers, opening of the external inguinal ring.  The  external oblique was dissected away from the underlying tissues and a self-retaining retractor were placed.  There was one sensory nerve intimately associated with the cord which was traced back laterally, clamped divided and ligated with a silk tie.  The redundant nerve was resected.  Cremasteric muscle fibers were skeletonized.  Large indirect sac was dissected away from the cord structures all the way back to the internal ring.  The sac was opened and inspected and was empty.  The indirect sac was then twisted and suture ligated at the level of the internal ring with a 2-0 Vicryl suture ligature.  The redundant sac was excised and discarded.  I tightened up the internal ring laterally with a figure-of-eight suture of 2-0 Vicryl.     The floor of the inguinal canal was repaired and reinforced with a 3 x 6 inch piece of ultra pro mesh.  The mesh was trimmed at the corners to accommodate the the anatomy of the wound.  The mesh was sutured in place  with running sutures of 2-0 Prolene in interrupted mattress sutures of 2-0 Prolene.  The mesh was sutured so as to generously overlap the fascia at the pubic tubercle, then along the inguinal ligament inferiorly.  Medially, superiorly and superior laterally several mattress sutures with Prolene were placed.  The mesh was incised laterally so as to wrap around the cord structures at the internal ring.  The tails of the mesh were overlapped laterally.  Further Prolene sutures were placed laterally.  This provided very secure coverage and repair both medial and lateral to the internal ring but allowed a small adequate fingertip opening for the cord structures.  The wound was irrigated.  There was no bleeding.  The external oblique was closed with a running suture of 2-0 Prolene placed in the cord structures deep to the external oblique.  Scarpa's fascia was closed with 3-0 Vicryl sutures and the skin closed with a running subcuticular 4-0 Monocryl and Dermabond.  The patient  tolerated the procedure well and was taken to PACU in stable condition.  EBL 10 cc.  Counts correct.  Complications none.    Addendum: I logged onto the PMP aware website and reviewed his prescription medication history     Harolyn Cocker M. Derrell Lolling, M.D., FACS General and Minimally Invasive Surgery Breast and Colorectal Surgery  11/09/2018 8:41 AM

## 2018-11-09 NOTE — Interval H&P Note (Signed)
History and Physical Interval Note:  11/09/2018 6:08 AM  Brent Brennan  has presented today for surgery, with the diagnosis of left inguinal hernia  The various methods of treatment have been discussed with the patient and family. After consideration of risks, benefits and other options for treatment, the patient has consented to  Procedure(s) with comments: OPEN REPAIR LEFT INGUINAL HERNIA WITH MESH ERAS PATHWAY (Left) - TAP BLOCK as a surgical intervention .  The patient's history has been reviewed, patient examined, no change in status, stable for surgery.  I have reviewed the patient's chart and labs.  Questions were answered to the patient's satisfaction.     [name]

## 2018-11-09 NOTE — Discharge Instructions (Signed)
[facility] _______[facility] ° °INGUINAL HERNIA REPAIR: POST OP INSTRUCTIONS ° °Always review your discharge instruction sheet given to you by the facility where your surgery was performed. °IF YOU HAVE DISABILITY OR FAMILY LEAVE FORMS, YOU MUST BRING THEM TO THE OFFICE FOR PROCESSING.   °DO NOT GIVE THEM TO YOUR DOCTOR. ° °1. A  prescription for pain medication may be given to you upon discharge.  Take your pain medication as prescribed, if needed.  If narcotic pain medicine is not needed, then you may take acetaminophen (Tylenol) or ibuprofen (Advil) as needed. °2. Take your usually prescribed medications unless otherwise directed. °If you need a refill on your pain medication, please contact your pharmacy.  They will contact our office to request authorization. Prescriptions will not be filled after 5 pm or on week-ends. °3. You should follow a light diet the first 24 hours after arrival home, such as soup and crackers, etc.  Be sure to include lots of fluids daily.  Resume your normal diet the day after surgery. °4.Most patients will experience some swelling and bruising  in the groin and scrotum.  Ice packs and reclining will help.  Swelling and bruising can take several days to resolve.  °6. It is common to experience some constipation if taking pain medication after surgery.  Increasing fluid intake and taking a stool softener (such as Colace) will usually help or prevent this problem from occurring.  A mild laxative (Milk of Magnesia or Miralax) should be taken according to package directions if there are no bowel movements after 48 hours. °7. Unless discharge instructions indicate otherwise, you may remove your bandages 24-48 hours after surgery, and you may shower at that time.  You may have steri-strips (small skin tapes) in place directly over the incision.  These strips should be left on the skin for 7-10 days.  If your surgeon used skin glue on the incision, you may shower in 24 hours.  The  glue will flake off over the next 2-3 weeks.  Any sutures or staples will be removed at the office during your follow-up visit. °8. ACTIVITIES:  You may resume regular (light) daily activities beginning the next day--such as daily self-care, walking, climbing stairs--gradually increasing activities as tolerated.  You may have sexual intercourse when it is comfortable.  Refrain from any heavy lifting or straining until approved by your doctor. ° °a.You may drive when you are no longer taking prescription pain medication, you can comfortably wear a seatbelt, and you can safely maneuver your car and apply brakes. °b.RETURN TO WORK:   °_____________________________________________ ° °9.You should see your doctor in the office for a follow-up appointment approximately 2-3 weeks after your surgery.  Make sure that you call for this appointment within a day or two after you arrive home to insure a convenient appointment time. °10.OTHER INSTRUCTIONS: _________________________ °   _____________________________________ ° °WHEN TO CALL YOUR DOCTOR: °1. Fever over 101.0 °2. Inability to urinate °3. Nausea and/or vomiting °4. Extreme swelling or bruising °5. Continued bleeding from incision. °6. Increased pain, redness, or drainage from the incision ° °The clinic staff is available to answer your questions during regular business hours.  Please don’t hesitate to call and ask to speak to one of the nurses for clinical concerns.  If you have a medical emergency, go to the nearest emergency room or call 911.  A surgeon from [facility] is always on call at the hospital ° ° °[address], [address], [place], [place]  [address] ? ° [address] [address], [place], [place]   [address] °[phone] ? [phone] ? FAX [phone] °Web site: [email or web address] ° ° ° ° ° ° ° ° ° °••••••••• ° ° °Managing Your Pain After Surgery Without Opioids ° ° ° °Thank you for participating in our program to help patients  manage their pain after surgery without opioids. This is part of our effort to provide you with the best care possible, without exposing you or your family to the risk that opioids pose. ° °What pain can I expect after surgery? °You can expect to have some pain after surgery. This is normal. The pain is typically worse the day after surgery, and quickly begins to get better. °Many studies have found that many patients are able to manage their pain after surgery with Over-the-Counter (OTC) medications such as Tylenol and Motrin. If you have a condition that does not allow you to take Tylenol or Motrin, notify your surgical team. ° °How will I manage my pain? °The best strategy for controlling your pain after surgery is around the clock pain control with Tylenol (acetaminophen) and Motrin (ibuprofen or Advil). Alternating these medications with each other allows you to maximize your pain control. In addition to Tylenol and Motrin, you can use heating pads or ice packs on your incisions to help reduce your pain. ° °How will I alternate your regular strength over-the-counter pain medication? °You will take a dose of pain medication every three hours. °; Start by taking 650 mg of Tylenol (2 pills of 325 mg) °; 3 hours later take 600 mg of Motrin (3 pills of 200 mg) °; 3 hours after taking the Motrin take 650 mg of Tylenol °; 3 hours after that take 600 mg of Motrin. ° ° °- 1 - ° °See example - if your first dose of Tylenol is at 12:00 PM ° ° °12:00 PM Tylenol 650 mg (2 pills of 325 mg)  °3:00 PM Motrin 600 mg (3 pills of 200 mg)  °6:00 PM Tylenol 650 mg (2 pills of 325 mg)  °9:00 PM Motrin 600 mg (3 pills of 200 mg)  °Continue alternating every 3 hours  ° °We recommend that you follow this schedule around-the-clock for at least 3 days after surgery, or until you feel that it is no longer needed. Use the table on the last page of this handout to keep track of the medications you are taking. °Important: °Do not take more  than 3000mg of Tylenol or 3200mg of Motrin in a 24-hour period. °Do not take ibuprofen/Motrin if you have a history of bleeding stomach ulcers, severe kidney disease, &/or actively taking a blood thinner ° °What if I still have pain? °If you have pain that is not controlled with the over-the-counter pain medications (Tylenol and Motrin or Advil) you might have what we call “breakthrough” pain. You will receive a prescription for a small amount of an opioid pain medication such as Oxycodone, Tramadol, or Tylenol with Codeine. Use these opioid pills in the first 24 hours after surgery if you have breakthrough pain. Do not take more than 1 pill every 4-6 hours. ° °If you still have uncontrolled pain after using all opioid pills, don't hesitate to call our staff using the number provided. We will help make sure you are managing your pain in the best way possible, and if necessary, we can provide a prescription for additional pain medication. ° ° °Day 1   ° °Time  °  Name of Medication Number of pills taken  °Amount of Acetaminophen  °Pain Level  ° °Comments  °AM PM       °AM PM       °AM PM       °AM PM       °AM PM       °AM PM       °AM PM       °AM PM       °Total Daily amount of Acetaminophen °Do not take more than  3,000 mg per day    ° ° °Day 2   ° °Time  °Name of Medication Number of pills °taken  °Amount of Acetaminophen  °Pain Level  ° °Comments  °AM PM       °AM PM       °AM PM       °AM PM       °AM PM       °AM PM       °AM PM       °AM PM       °Total Daily amount of Acetaminophen °Do not take more than  3,000 mg per day    ° ° °Day 3   ° °Time  °Name of Medication Number of pills taken  °Amount of Acetaminophen  °Pain Level  ° °Comments  °AM PM       °AM PM       °AM PM       °AM PM       ° ° ° °AM PM       °AM PM       °AM PM       °AM PM       °Total Daily amount of Acetaminophen °Do not take more than  3,000 mg per day    ° ° °Day 4   ° °Time  °Name of Medication Number of pills taken  °Amount of  Acetaminophen  °Pain Level  ° °Comments  °AM PM       °AM PM       °AM PM       °AM PM       °AM PM       °AM PM       °AM PM       °AM PM       °Total Daily amount of Acetaminophen °Do not take more than  3,000 mg per day    ° ° °Day 5   ° °Time  °Name of Medication Number °of pills taken  °Amount of Acetaminophen  °Pain Level  ° °Comments  °AM PM       °AM PM       °AM PM       °AM PM       °AM PM       °AM PM       °AM PM       °AM PM       °Total Daily amount of Acetaminophen °Do not take more than  3,000 mg per day    ° ° ° °Day 6   ° °Time  °Name of Medication Number of pills °taken  °Amount of Acetaminophen  °Pain Level  °Comments  °AM PM       °AM PM       °AM PM       °AM PM       °AM   PM       °AM PM       °AM PM       °AM PM       °Total Daily amount of Acetaminophen °Do not take more than  3,000 mg per day    ° ° °Day 7   ° °Time  °Name of Medication Number of pills taken  °Amount of Acetaminophen  °Pain Level  ° °Comments  °AM PM       °AM PM       °AM PM       °AM PM       °AM PM       °AM PM       °AM PM       °AM PM       °Total Daily amount of Acetaminophen °Do not take more than  3,000 mg per day    ° ° ° ° °For additional information about how and where to safely dispose of unused opioid °medications - https://www.morepowerfulnc.org ° °Disclaimer: This document contains information and/or instructional materials adapted from [facility] for the typical patient with your condition. It does not replace medical advice from your health care provider because your experience may differ from that of the °typical patient. Talk to your health care provider if you have any questions about this °document, your condition or your treatment plan. °Adapted from [facility] ° °

## 2018-11-09 NOTE — Anesthesia Procedure Notes (Signed)
Procedure Name: Intubation Date/Time: 11/09/2018 7:44 AM Performed by: Rosiland Oz, CRNA Pre-anesthesia Checklist: Patient identified, Emergency Drugs available, Suction available, Patient being monitored and Timeout performed Patient Re-evaluated:Patient Re-evaluated prior to induction Oxygen Delivery Method: Circle system utilized Preoxygenation: Pre-oxygenation with 100% oxygen Induction Type: IV induction Ventilation: Mask ventilation without difficulty Laryngoscope Size: Miller and 3 Grade View: Grade I Tube type: Oral Tube size: 7.5 mm Number of attempts: 1 Airway Equipment and Method: Stylet Placement Confirmation: ETT inserted through vocal cords under direct vision,  positive ETCO2 and breath sounds checked- equal and bilateral Secured at: 21 cm Tube secured with: Tape Dental Injury: Teeth and Oropharynx as per pre-operative assessment

## 2018-11-10 ENCOUNTER — Encounter (HOSPITAL_COMMUNITY): Payer: Self-pay | Admitting: General Surgery

## 2019-03-15 ENCOUNTER — Emergency Department (HOSPITAL_COMMUNITY): Payer: Medicare Other

## 2019-03-15 ENCOUNTER — Other Ambulatory Visit: Payer: Self-pay

## 2019-03-15 ENCOUNTER — Encounter (HOSPITAL_COMMUNITY): Payer: Self-pay | Admitting: *Deleted

## 2019-03-15 ENCOUNTER — Emergency Department (HOSPITAL_COMMUNITY)
Admission: EM | Admit: 2019-03-15 | Discharge: 2019-03-15 | Disposition: A | Discharge status: Home / Self Care | Payer: Medicare Other | Attending: Emergency Medicine | Admitting: Emergency Medicine

## 2019-03-15 DIAGNOSIS — Z87891 Personal history of nicotine dependence: Secondary | ICD-10-CM | POA: Diagnosis not present

## 2019-03-15 DIAGNOSIS — K625 Hemorrhage of anus and rectum: Secondary | ICD-10-CM

## 2019-03-15 DIAGNOSIS — R410 Disorientation, unspecified: Secondary | ICD-10-CM | POA: Diagnosis not present

## 2019-03-15 DIAGNOSIS — Z79899 Other long term (current) drug therapy: Secondary | ICD-10-CM | POA: Insufficient documentation

## 2019-03-15 DIAGNOSIS — Z7982 Long term (current) use of aspirin: Secondary | ICD-10-CM | POA: Insufficient documentation

## 2019-03-15 DIAGNOSIS — I10 Essential (primary) hypertension: Secondary | ICD-10-CM | POA: Diagnosis not present

## 2019-03-15 DIAGNOSIS — F0390 Unspecified dementia without behavioral disturbance: Secondary | ICD-10-CM | POA: Diagnosis not present

## 2019-03-15 DIAGNOSIS — K921 Melena: Secondary | ICD-10-CM | POA: Diagnosis present

## 2019-03-15 LAB — SALICYLATE LEVEL: Salicylate Lvl: <7 mg/dL (ref 2.8–30.0)

## 2019-03-15 LAB — CBC
HCT: 41.5 % (ref 39.0–52.0)
Hemoglobin: 14.1 g/dL (ref 13.0–17.0)
MCH: 31.5 pg (ref 26.0–34.0)
MCHC: 34 g/dL (ref 30.0–36.0)
MCV: 92.6 fL (ref 80.0–100.0)
Platelets: 159 10*3/uL (ref 150–400)
RBC: 4.48 MIL/uL (ref 4.22–5.81)
RDW: 12.6 % (ref 11.5–15.5)
WBC: 4.8 10*3/uL (ref 4.0–10.5)
nRBC: 0 % (ref 0.0–0.2)

## 2019-03-15 LAB — COMPREHENSIVE METABOLIC PANEL
ALT: 16 U/L (ref 0–44)
AST: 21 U/L (ref 15–41)
Albumin: 4.1 g/dL (ref 3.5–5.0)
Alkaline Phosphatase: 40 U/L (ref 38–126)
Anion gap: 9 (ref 5–15)
BUN: 10 mg/dL (ref 8–23)
CO2: 28 mmol/L (ref 22–32)
Calcium: 9 mg/dL (ref 8.9–10.3)
Chloride: 96 mmol/L — ABNORMAL LOW (ref 98–111)
Creatinine, Ser: 0.89 mg/dL (ref 0.61–1.24)
GFR calc Af Amer: >60 mL/min (ref >60)
GFR calc non Af Amer: >60 mL/min (ref >60)
Glucose, Bld: 195 mg/dL — ABNORMAL HIGH (ref 70–99)
Potassium: 3.4 mmol/L — ABNORMAL LOW (ref 3.5–5.1)
Sodium: 133 mmol/L — ABNORMAL LOW (ref 135–145)
Total Bilirubin: 1.2 mg/dL (ref 0.3–1.2)
Total Protein: 6 g/dL — ABNORMAL LOW (ref 6.5–8.1)

## 2019-03-15 LAB — ABO/RH: ABO/RH(D): A POS

## 2019-03-15 LAB — TYPE AND SCREEN
ABO/RH(D): A POS
Antibody Screen: NEGATIVE

## 2019-03-15 LAB — ACETAMINOPHEN LEVEL: Acetaminophen (Tylenol), Serum: <10 ug/mL — ABNORMAL LOW (ref 10–30)

## 2019-03-15 MED ORDER — IOHEXOL 300 MG/ML  SOLN
100.0000 mL | Freq: Once | INTRAMUSCULAR | Status: AC | PRN
  Administered 2019-03-15: 100 mL via INTRAVENOUS

## 2019-03-15 MED ORDER — SODIUM CHLORIDE 0.9 % IV SOLN
INTRAVENOUS | Status: DC
  Administered 2019-03-15: 16:00:00 via INTRAVENOUS

## 2019-03-15 NOTE — ED Notes (Signed)
[phone]- Brent Brennan (daughter) would like an update once results are back from CT.

## 2019-03-15 NOTE — Discharge Instructions (Addendum)
As discussed, your evaluation today has been largely reassuring.  But, it is important that you monitor your condition carefully, and do not hesitate to return to the ED if you develop new, or concerning changes in your condition. ? ?Otherwise, please follow-up with your physician for appropriate ongoing care. ? ?

## 2019-03-15 NOTE — ED Notes (Signed)
Patient transported to CT 

## 2019-03-15 NOTE — ED Provider Notes (Signed)
[facility] Provider Note   CSN: [id number] Arrival date & time: 03/15/19  1343    History   Chief Complaint Chief Complaint  Patient presents with  . Rectal Bleeding    HPI Cecilie Kicksubrey L Pavlich is a 85 y.o. male.     HPI Patient presents after an episode of bloody stool. Patient states that he is generally well, does not take blood thinning medication, does have a history of dementia, with memory loss. However, the patient is awake, alert, oriented to his current affairs, notes that the memory loss is a more long-term issue. He denies any current pain including chest pain, abdominal pain, fever, chills, nausea, vomiting Earlier today, the patient had a bowel movement with substantial amounts of blood mixed in. No lightheadedness, no syncope at that time. He notes that he is previously had episodes, he has some concern about this being due to intake of Tylenol PM, otherwise denies change in recent medication, diet, activity.   Past Medical History:  Diagnosis Date  . Arthritis   . Dementia (HCC)   . GERD (gastroesophageal reflux disease)   . History of rib fracture 2015   fell from ladder  . Hypertension   . Left inguinal hernia 11/09/2018    Patient Active Problem List   Diagnosis Date Noted  . Left inguinal hernia 11/09/2018    Past Surgical History:  Procedure Laterality Date  . CATARACT EXTRACTION, BILATERAL    . CHOLECYSTECTOMY    . EYE SURGERY    . HERNIA REPAIR     Right inguinal  . INGUINAL HERNIA REPAIR Left 11/09/2018   Procedure: OPEN REPAIR LEFT INGUINAL HERNIA WITH MESH ERAS PATHWAY;  Surgeon: Claud KelpIngram, Haywood, MD;  [facility];  Service: General;  Laterality: Left;  TAP BLOCK        Home Medications    Prior to Admission medications   Medication Sig Start Date End Date Taking? Authorizing Provider  Ascorbic Acid (VITAMIN C) 1000 MG tablet Take 1,000 mg by mouth daily.   Yes [provider]   aspirin EC 81 MG tablet Take 81 mg by mouth daily.   Yes [provider]  calcium carbonate (OSCAL) 1500 (600 Ca) MG TABS tablet Take 600 mg of elemental calcium by mouth daily with breakfast.   Yes [provider]  Cholecalciferol (VITAMIN D3) 50 MCG (2000 UT) capsule Take 2,000 Units by mouth daily.   Yes [provider]  hydrochlorothiazide (HYDRODIURIL) 25 MG tablet Take 25 mg by mouth daily. 08/31/18  Yes [provider]  memantine (NAMENDA) 10 MG tablet Take 10 mg by mouth 2 (two) times daily. 10/09/18  Yes [provider]  Multiple Vitamin (MULTIVITAMIN WITH MINERALS) TABS tablet Take 1 tablet by mouth daily.   Yes [provider]  Multiple Vitamins-Minerals (PRESERVISION AREDS 2 PO) Take 1 tablet by mouth 2 (two) times daily.   Yes [provider]  omeprazole (PRILOSEC) 20 MG capsule Take 20 mg by mouth daily.   Yes [provider]  Potassium 99 MG TABS Take 99 mg by mouth daily.   Yes [provider]  traMADol (ULTRAM) 50 MG tablet Take 1 tablet (50 mg total) by mouth every 6 (six) hours as needed for severe pain (Only if needed). Patient not taking: Reported on 03/15/2019 11/09/18   Claud KelpIngram, Haywood, MD    Family History History reviewed. No pertinent family history.  Social History Social History   Tobacco Use  . Smoking  status: Former Smoker    Packs/day: 2.00    Years: 25.00    Pack years: 50.00    Types: Cigarettes    Last attempt to quit: 10/15/1971    Years since quitting: 47.4  . Smokeless tobacco: Never Used  Substance Use Topics  . Alcohol use: Not Currently  . Drug use: Never     Allergies   Patient has no known allergies.   Review of Systems Review of Systems  Constitutional:       Per HPI, otherwise negative  HENT:       Per HPI, otherwise negative  Respiratory:       Per HPI, otherwise negative  Cardiovascular:       Per HPI, otherwise negative  Gastrointestinal: Positive  for blood in stool. Negative for vomiting.  Endocrine:       Negative aside from HPI  Genitourinary:       Neg aside from HPI   Musculoskeletal:       Per HPI, otherwise negative  Skin: Negative.   Neurological: Negative for syncope.  Psychiatric/Behavioral: Positive for confusion and decreased concentration.     Physical Exam Updated Vital Signs BP (!) 154/83 (BP Location: Right Arm)   Pulse 76   Temp 98.3 F (36.8 C) (Oral)   Resp (!) 26   Ht  (1.727 m)   Wt 59.9 kg   SpO2 96%   BMI 20.07 kg/m   Physical Exam Vitals signs and nursing note reviewed.  Constitutional:      General: He is not in acute distress.    Appearance: He is well-developed.  HENT:     Head: Normocephalic and atraumatic.  Eyes:     Conjunctiva/sclera: Conjunctivae normal.  Cardiovascular:     Rate and Rhythm: Normal rate and regular rhythm.  Pulmonary:     Effort: Pulmonary effort is normal. No respiratory distress.     Breath sounds: No stridor.  Abdominal:     General: There is no distension.     Tenderness: There is no abdominal tenderness. There is no guarding.  Skin:    General: Skin is warm and dry.  Neurological:     Mental Status: He is alert and oriented to person, place, and time.     Comments: Minor atrophy otherwise unremarkable  Psychiatric:     Comments: Patient acknowledges minor memory loss, but in regards to current situation is awake, alert, oriented appropriately      ED Treatments / Results  Labs (all labs ordered are listed, but only abnormal results are displayed) Labs Reviewed  COMPREHENSIVE METABOLIC PANEL - Abnormal; Notable for the following components:      Result Value   Sodium 133 (*)    Potassium 3.4 (*)    Chloride 96 (*)    Glucose, Bld 195 (*)    Total Protein 6.0 (*)    All other components within normal limits  ACETAMINOPHEN LEVEL - Abnormal; Notable for the following components:   Acetaminophen (Tylenol), Serum <10 (*)    All other  components within normal limits  CBC  SALICYLATE LEVEL  POC OCCULT BLOOD, ED  TYPE AND SCREEN  ABO/RH    EKG None  Radiology Ct Abdomen Pelvis W Contrast  Result Date: 03/15/2019 CLINICAL DATA:  Rectal bleeding. EXAM: CT ABDOMEN AND PELVIS WITH CONTRAST TECHNIQUE: Multidetector CT imaging of the abdomen and pelvis was performed using the standard protocol following bolus administration of intravenous contrast. CONTRAST:  OMNIPAQUE IOHEXOL 300 MG/ML  SOLN COMPARISON:  CT scan of June 05, 2010. FINDINGS: Lower chest: Elevated left hemidiaphragm is noted. Visualized right lung base is unremarkable. Hepatobiliary: No focal liver abnormality is seen. Status post cholecystectomy. No biliary dilatation. Pancreas: Unremarkable. No pancreatic ductal dilatation or surrounding inflammatory changes. Spleen: Normal in size without focal abnormality. Adrenals/Urinary Tract: Adrenal glands appear normal. Bilateral renal cysts are noted. No hydronephrosis or renal obstruction is noted. No renal or ureteral calculi are noted. Urinary bladder is unremarkable. Stomach/Bowel: Only part of the stomach is visualized, due to significantly elevated left hemidiaphragm. There is no evidence of bowel obstruction or inflammation. The appendix appears normal. Sigmoid diverticulosis is noted without inflammation. Vascular/Lymphatic: Aortic atherosclerosis. No enlarged abdominal or pelvic lymph nodes. Reproductive: Prostate is unremarkable. Other: No abdominal wall hernia or abnormality. No abdominopelvic ascites. Musculoskeletal: No acute or significant osseous findings. IMPRESSION: Elevated left hemidiaphragm is again noted. No acute abnormality seen in the abdomen or pelvis. Sigmoid diverticulosis without inflammation. Aortic Atherosclerosis (ICD10-I70.0). Electronically Signed   By: Lupita Raider M.D.   On: 03/15/2019 17:32    Procedures Procedures (including critical care time)  Medications Ordered in ED  Medications  0.9 %  sodium chloride infusion ( Intravenous New Bag/Given 03/15/19 1600)  iohexol (OMNIPAQUE) 300 MG/ML solution 100 mL (100 mLs Intravenous Contrast Given 03/15/19 1711)     Initial Impression / Assessment and Plan / ED Course  I have reviewed the triage vital signs and the nursing notes.  Pertinent labs & imaging results that were available during my care of the patient were reviewed by me and considered in my medical decision making (see chart for details).        7:32 PM Patient in no distress, awake, alert, smiling, interactive. Now, hours after his initial bowel movement with stool and blood he has had no additional bowel movements with bleeding. I discussed the findings with him at length, and given his memory loss, also with his daughter, again at length. She notes that the patient has been deemed not a candidate for additional colonoscopy, has a history of diverticulosis as well. We discussed importance of her assisting him with monitoring, following up with primary care.  This elderly male presents after episode of bloody stool. Patient does have a history of diverticulosis, he is not anticoagulated, has no abdominal pain, and with labs, CT, reassuring, no evidence for substantial exsanguination, diverticulitis, some suspicion for diverticulosis. After hours of monitoring, no decompensation, patient was discharged in stable condition.  Final Clinical Impressions(s) / ED Diagnoses   Final diagnoses:  Rectal bleeding     Gerhard Munch, MD 03/15/19 Barry Brunner

## 2019-03-15 NOTE — ED Notes (Signed)
RN contacted Daughter via phone, she confirmed that she just spoke to Provider and is on the way.  No questions at this time.

## 2019-03-15 NOTE — ED Triage Notes (Signed)
Pt in c/o episode of rectal bleeding this am, reports blood was bright red, denies pain, reports he also took 6-8 tylenol PM last night because he could not fall asleep, pt concerned that caused his symptoms, no distress noted

## 2019-10-28 ENCOUNTER — Ambulatory Visit: Payer: Medicare Other | Attending: Internal Medicine

## 2019-10-28 DIAGNOSIS — Z23 Encounter for immunization: Secondary | ICD-10-CM | POA: Insufficient documentation

## 2019-10-28 NOTE — Progress Notes (Signed)
   Covid-19 Vaccination Clinic  Name:  Brent Brennan    MRN: [id number] DOB: 1930/11/22  10/28/2019  Brent Brennan was observed post Covid-19 immunization for 15 minutes without incidence. He was provided with Vaccine Information Sheet and instruction to access the V-Safe system.   Brent Brennan was instructed to call 911 with any severe reactions post vaccine: Marland Kitchen Difficulty breathing  . Swelling of your face and throat  . A fast heartbeat  . A bad rash all over your body  . Dizziness and weakness    Immunizations Administered    Name Date Dose VIS Date Route   Pfizer COVID-19 Vaccine 10/28/2019 11:35 AM 0.3 mL 09/24/2019 Intramuscular   Manufacturer: [employer], [employer]   Lot: [id number]   NDC: [address]-3

## 2019-10-29 ENCOUNTER — Ambulatory Visit: Payer: Medicare Other

## 2019-11-18 ENCOUNTER — Ambulatory Visit: Payer: Medicare Other | Attending: Internal Medicine

## 2019-11-18 DIAGNOSIS — Z23 Encounter for immunization: Secondary | ICD-10-CM | POA: Insufficient documentation

## 2019-11-18 NOTE — Progress Notes (Signed)
   Covid-19 Vaccination Clinic  Name:  VADA YELLEN    MRN: [id number] DOB: June 04, 1931  11/18/2019  Mr. Vaquera was observed post Covid-19 immunization for 15 minutes without incidence. He was provided with Vaccine Information Sheet and instruction to access the V-Safe system.   Mr. Dillenbeck was instructed to call 911 with any severe reactions post vaccine: Marland Kitchen Difficulty breathing  . Swelling of your face and throat  . A fast heartbeat  . A bad rash all over your body  . Dizziness and weakness    Immunizations Administered    Name Date Dose VIS Date Route   Pfizer COVID-19 Vaccine 11/18/2019  9:45 AM 0.3 mL 09/24/2019 Intramuscular   Manufacturer: [employer], [employer]   Lot: [id number]   NDC: [address]-4

## 2020-05-02 IMAGING — CT CT ABDOMEN AND PELVIS WITH CONTRAST
2 of 5 series · 16 of 46 positions shown, 18 images · IV contrast (APPLIED)
Comparison: CT scan of June 05, 2010.

CLINICAL DATA: Rectal bleeding.

EXAM:
CT ABDOMEN AND PELVIS WITH CONTRAST
TECHNIQUE: Multidetector CT imaging of the abdomen and pelvis was performed
using the standard protocol following bolus administration of
intravenous contrast.
CONTRAST:  100mL OMNIPAQUE IOHEXOL 300 MG/ML  SOLN

[Series 3: abd/ pelvis 5.0 i30f 2 · axial · 0.82mm/px · z∈[+811,+1221]mm · 13 of 92 slices shown, 15 images]
[im 5/92  soft-tissue]
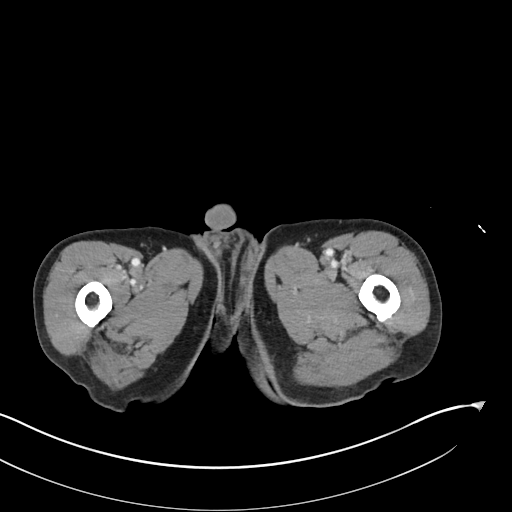
[im 5/92  bone]
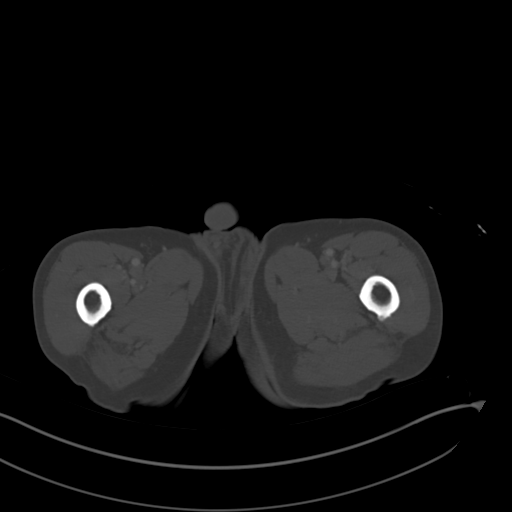
[im 15/92  soft-tissue]
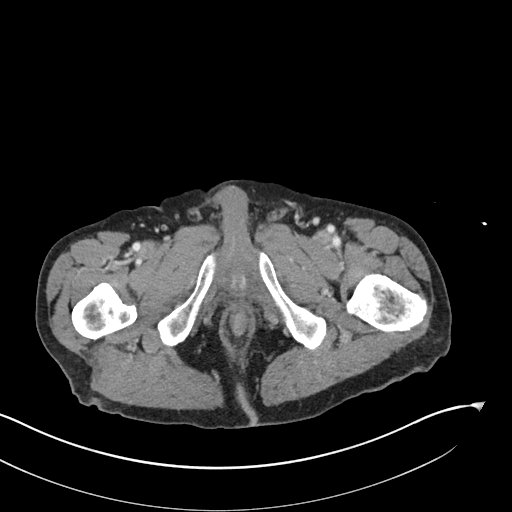
[im 20/92  soft-tissue]
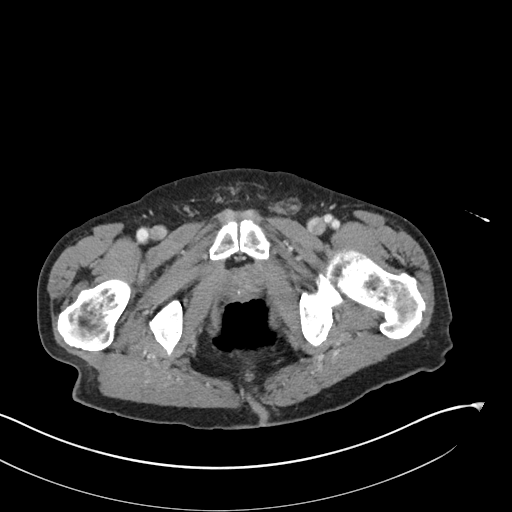
[im 24/92  soft-tissue]
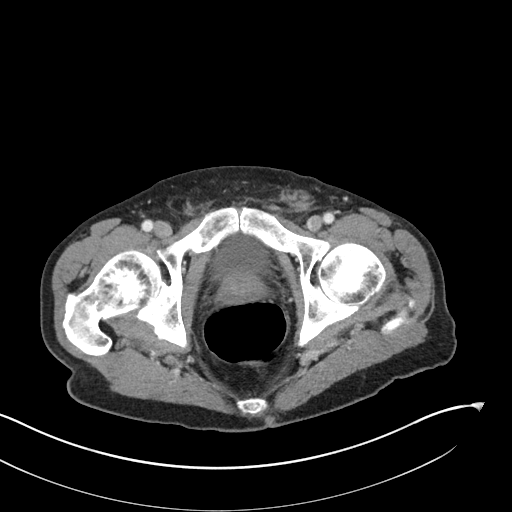
[im 34/92  soft-tissue]
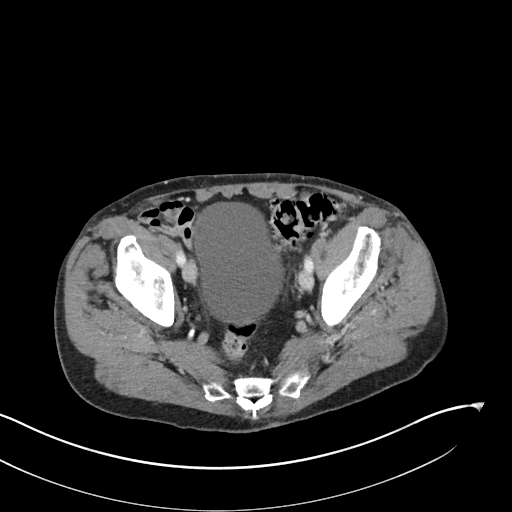
[im 39/92  soft-tissue]
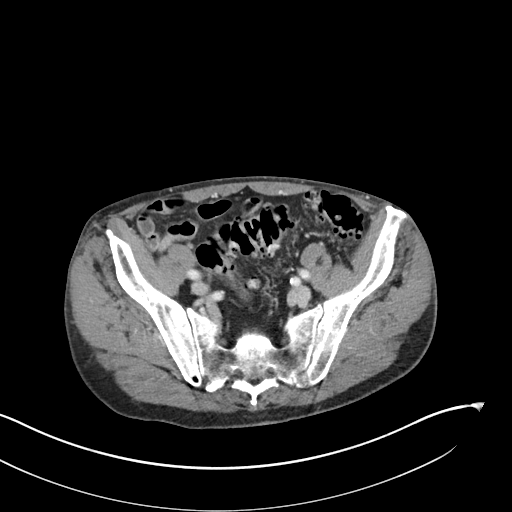
[im 48/92  soft-tissue]
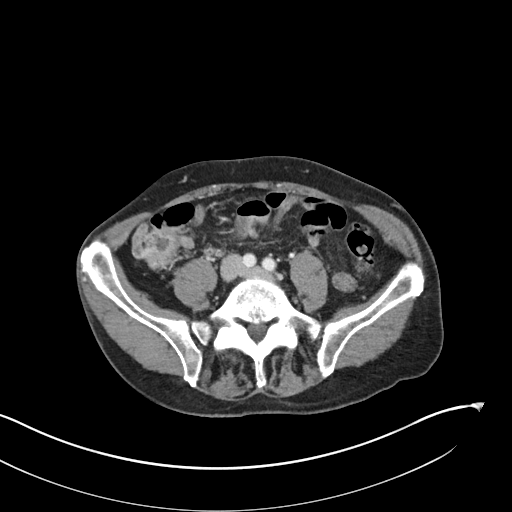
[im 53/92  soft-tissue]
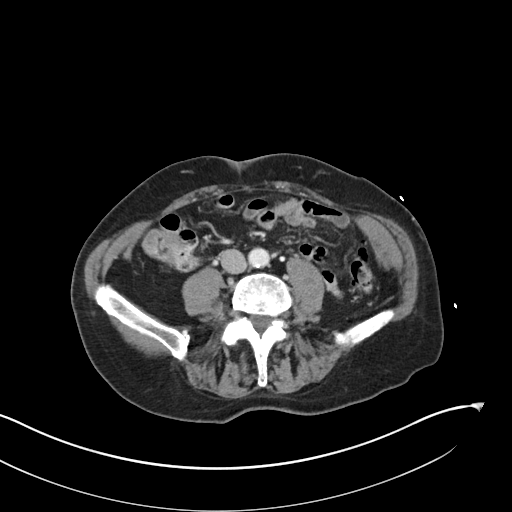
[im 58/92  soft-tissue]
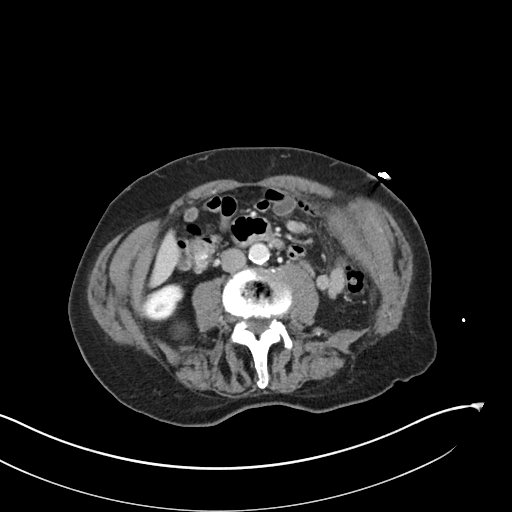
[im 58/92  bone]
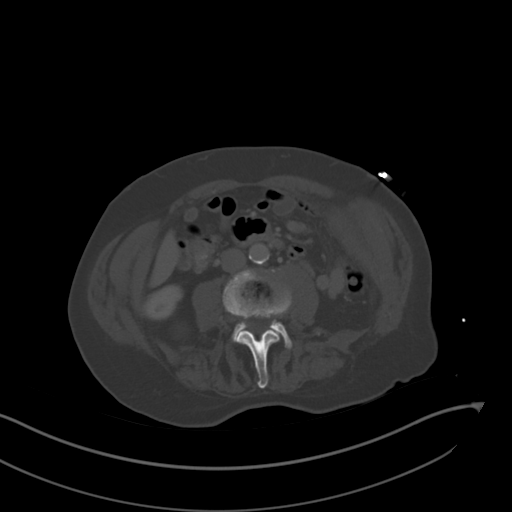
[im 68/92  soft-tissue]
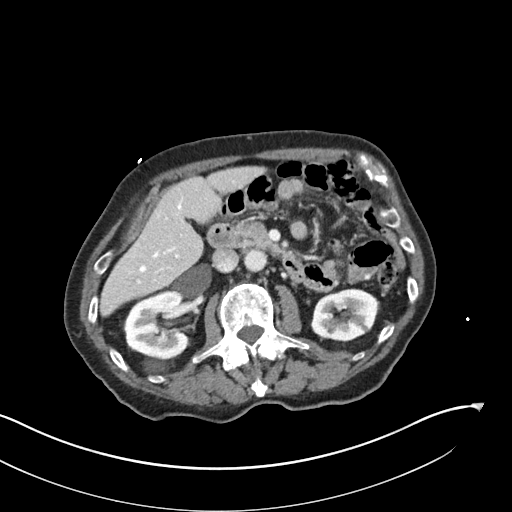
[im 72/92  soft-tissue]
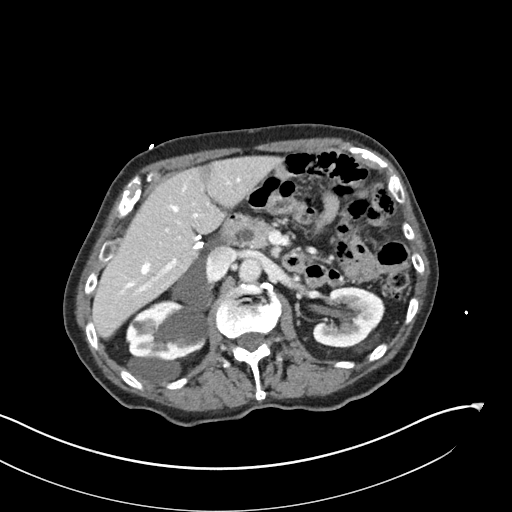
[im 77/92  soft-tissue]
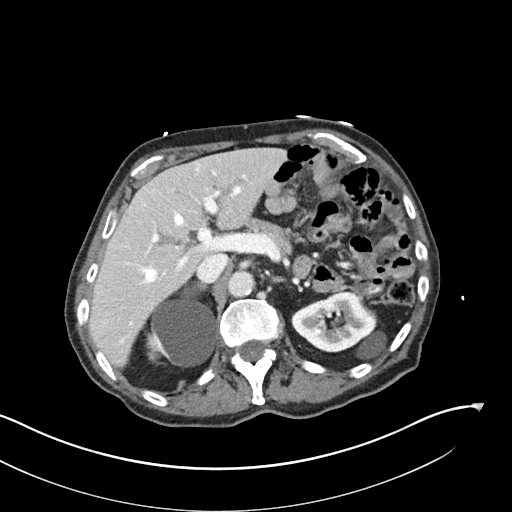
[im 87/92  soft-tissue]
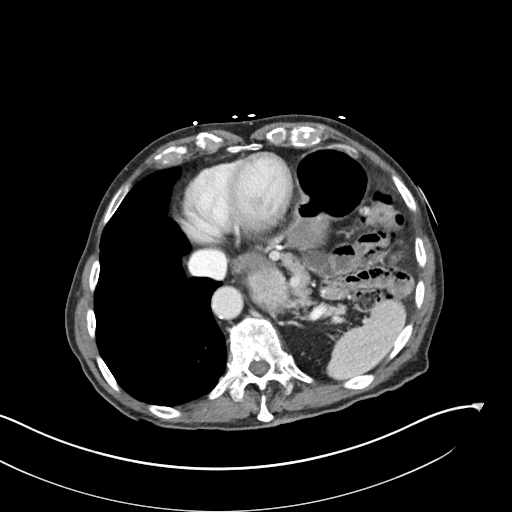

[Series 6: coronal soft tissue · coronal · 0.80mm/px · 3 of 97 slices shown]
[im 33/97  soft-tissue]
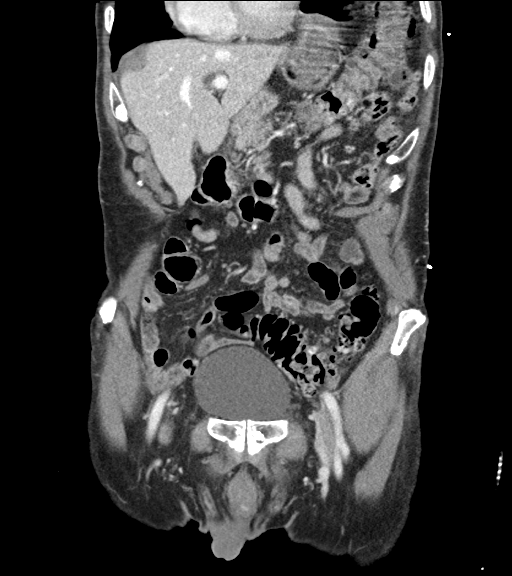
[im 43/97  soft-tissue]
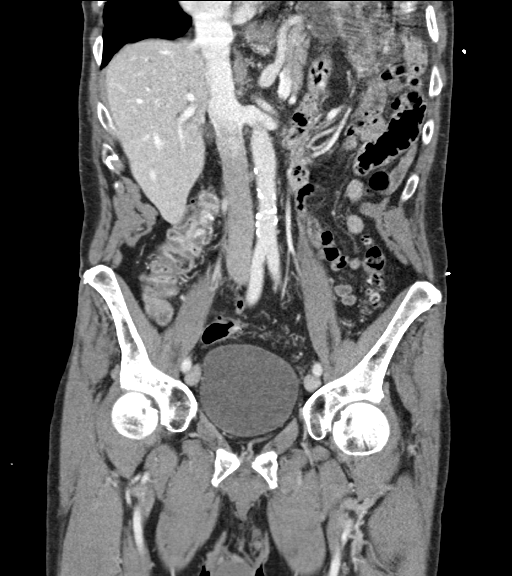
[im 54/97  soft-tissue]
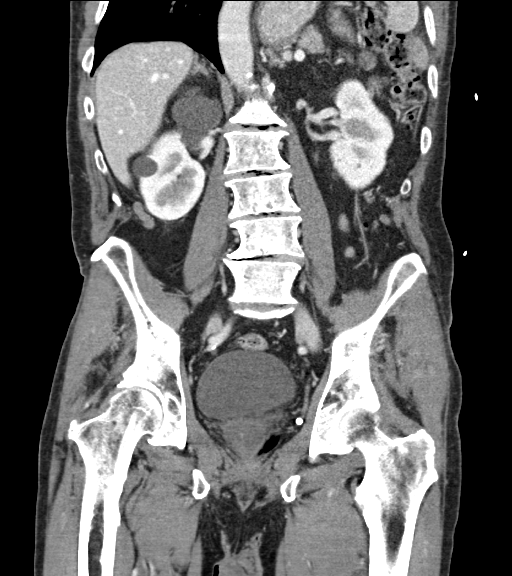

[16 of 46 positions shown; findings below may reference images not displayed]

FINDINGS: Lower chest: Elevated left hemidiaphragm is noted. Visualized right
lung base is unremarkable.

Hepatobiliary: No focal liver abnormality is seen. Status post
cholecystectomy. No biliary dilatation.

Pancreas: Unremarkable. No pancreatic ductal dilatation or
surrounding inflammatory changes.

Spleen: Normal in size without focal abnormality.

Adrenals/Urinary Tract: Adrenal glands appear normal. Bilateral
renal cysts are noted. No hydronephrosis or renal obstruction is
noted. No renal or ureteral calculi are noted. Urinary bladder is
unremarkable.

Stomach/Bowel: Only part of the stomach is visualized, due to
significantly elevated left hemidiaphragm. There is no evidence of
bowel obstruction or inflammation. The appendix appears normal.
Sigmoid diverticulosis is noted without inflammation.

Vascular/Lymphatic: Aortic atherosclerosis. No enlarged abdominal or
pelvic lymph nodes.

Reproductive: Prostate is unremarkable.

Other: No abdominal wall hernia or abnormality. No abdominopelvic
ascites.

Musculoskeletal: No acute or significant osseous findings.
IMPRESSION: Elevated left hemidiaphragm is again noted.

No acute abnormality seen in the abdomen or pelvis.

Sigmoid diverticulosis without inflammation.

Aortic Atherosclerosis ([id number]-XH3.3).

## 2020-11-14 ENCOUNTER — Other Ambulatory Visit (HOSPITAL_COMMUNITY): Payer: Self-pay | Admitting: Internal Medicine

## 2020-11-14 ENCOUNTER — Ambulatory Visit (HOSPITAL_COMMUNITY)
Admission: RE | Admit: 2020-11-14 | Discharge: 2020-11-14 | Disposition: A | Discharge status: Home / Self Care | Payer: Medicare Other | Source: Ambulatory Visit | Attending: Internal Medicine | Admitting: Internal Medicine

## 2020-11-14 ENCOUNTER — Other Ambulatory Visit: Payer: Self-pay

## 2020-11-14 DIAGNOSIS — R519 Headache, unspecified: Secondary | ICD-10-CM | POA: Insufficient documentation

## 2020-11-14 MED ORDER — GADOBUTROL 1 MMOL/ML IV SOLN
6.0000 mL | Freq: Once | INTRAVENOUS | Status: AC | PRN
  Administered 2020-11-14: 6 mL via INTRAVENOUS

## 2020-12-26 ENCOUNTER — Other Ambulatory Visit: Payer: Self-pay

## 2020-12-26 ENCOUNTER — Ambulatory Visit: Payer: Medicare Other | Admitting: Gastroenterology

## 2020-12-26 ENCOUNTER — Encounter: Payer: Self-pay | Admitting: Gastroenterology

## 2020-12-26 VITALS — BP 86/52 | HR 86 | Ht 68.0 in | Wt 139.2 lb

## 2020-12-26 DIAGNOSIS — K219 Gastro-esophageal reflux disease without esophagitis: Secondary | ICD-10-CM

## 2020-12-26 DIAGNOSIS — R1319 Other dysphagia: Secondary | ICD-10-CM | POA: Diagnosis not present

## 2020-12-26 DIAGNOSIS — Z86010 Personal history of colonic polyps: Secondary | ICD-10-CM | POA: Diagnosis not present

## 2020-12-26 DIAGNOSIS — R1310 Dysphagia, unspecified: Secondary | ICD-10-CM

## 2020-12-26 NOTE — Patient Instructions (Signed)
If you are age 85 or older, your body mass index should be between 23-30. Your Body mass index is 21.17 kg/m. If this is out of the aforementioned range listed, please consider follow up with your Primary Care Provider.  You have been scheduled for an endoscopy. Please follow written instructions given to you at your visit today. If you use inhalers (even only as needed), please bring them with you on the day of your procedure.  Due to recent changes in healthcare laws, you may see the results of your imaging and laboratory studies on MyChart before your provider has had a chance to review them.  We understand that in some cases there may be results that are confusing or concerning to you. Not all laboratory results come back in the same time frame and the provider may be waiting for multiple results in order to interpret others.  Please give [place] 48 hours in order for your provider to thoroughly review all the results before contacting the office for clarification of your results.   Thank you for choosing me and [facility].  Dr. Meridee Score

## 2020-12-27 ENCOUNTER — Encounter: Payer: Self-pay | Admitting: Gastroenterology

## 2020-12-27 NOTE — Progress Notes (Signed)
[place] VISIT   Primary Care Provider Prince Solian, [place] [address] [phone]  Referring Provider Prince Solian, [address] [place],  [place] [address] [phone]  Patient Profile: Brent Brennan is a 87 y.o. male with a pmh significant for hypertension, arthritis, status post inguinal hernia repair, dementia, GERD, colon polyps, diverticulosis (with prior history of diverticulitis).  The patient presents to the [facility] for an evaluation and management of problem(s) noted below:  Problem List 1. Esophageal dysphagia   2. Gastroesophageal reflux disease, unspecified whether esophagitis present   3. History of colonic polyps     History of Present Illness This is the patient's first visit in the [facility].  Patient has previously seen Dr. Earlean Shawl in the past for upper and lower endoscopies although this was greater than 15 years ago.  The patient has had acid reflux for years that has been relatively controlled with once daily PPI 20 mg.  He has been experiencing issues of chronic throat clearing.  He denies overt regurgitation of foods.  He has no issues with liquids.  His problems stem from solid foods or breads that feel like they get caught in the region of the sternal notch at which point liquids will allow it to pass.  He denies hematemesis or coffee-ground emesis.  No abdominal pain.  He is previously had diverticulitis but is not had that for years.  He has a history of colon polyps.  Patient takes an aspirin but takes no other significant nonsteroidals or BC/Goody powders.  GI Review of Systems Positive as above Negative for odynophagia, nausea, change in bowel habits, melena, hematochezia  Review of Systems General: Denies fevers/chills/weight loss unintentionally HEENT: Denies oral lesions/sore throat Cardiovascular: Denies chest pain/palpitations Pulmonary: Denies  shortness of breath Gastroenterological: See HPI Genitourinary: Denies darkened urine or hematuria Hematological: Denies easy bruising/bleeding Dermatological: Denies jaundice Psychological: Mood is stable   Medications Current Outpatient Medications  Medication Sig Dispense Refill  . Ascorbic Acid (VITAMIN C) 1000 MG tablet Take 1,000 mg by mouth daily.    Marland Kitchen aspirin EC 81 MG tablet Take 81 mg by mouth daily.    . calcium carbonate (OSCAL) 1500 (600 Ca) MG TABS tablet Take 600 mg of elemental calcium by mouth daily with breakfast.    . Cholecalciferol (VITAMIN D3) 50 MCG (2000 UT) capsule Take 2,000 Units by mouth daily.    Marland Kitchen escitalopram (LEXAPRO) 10 MG tablet Take 10 mg by mouth daily.    . hydrochlorothiazide (HYDRODIURIL) 25 MG tablet Take 25 mg by mouth daily.    . memantine (NAMENDA) 10 MG tablet Take 10 mg by mouth 2 (two) times daily.    . mirtazapine (REMERON) 15 MG tablet Take 15 mg by mouth at bedtime as needed.    . Multiple Vitamin (MULTIVITAMIN WITH MINERALS) TABS tablet Take 1 tablet by mouth daily.    . Multiple Vitamins-Minerals (PRESERVISION AREDS 2 PO) Take 1 tablet by mouth 2 (two) times daily.    Marland Kitchen omeprazole (PRILOSEC) 20 MG capsule Take 20 mg by mouth daily.    . Potassium 99 MG TABS Take 99 mg by mouth daily.    . traMADol (ULTRAM) 50 MG tablet Take 1 tablet (50 mg total) by mouth every 6 (six) hours as needed for severe pain (Only if needed). 15 tablet 0   No current facility-administered medications for this visit.    Allergies No Known Allergies  Histories Past Medical History:  Diagnosis Date  . Arthritis   . Dementia ([place])   . GERD (gastroesophageal reflux disease)   . History of rib fracture 2015   fell from ladder  . Hypertension   . Left inguinal hernia 11/09/2018   Past Surgical History:  Procedure Laterality Date  . CATARACT EXTRACTION, BILATERAL    . CHOLECYSTECTOMY    . COLONOSCOPY    . EYE SURGERY    . HERNIA REPAIR     Right  inguinal  . INGUINAL HERNIA REPAIR Left 11/09/2018   Procedure: OPEN REPAIR LEFT INGUINAL HERNIA WITH MESH ERAS PATHWAY;  Surgeon: Fanny Skates, MD;  Location: [facility];  Service: General;  Laterality: Left;  TAP BLOCK  . UPPER GASTROINTESTINAL ENDOSCOPY     Social History   Socioeconomic History  . Marital status: Widowed    Spouse name: Not on file  . Number of children: Not on file  . Years of education: Not on file  . Highest education level: Not on file  Occupational History  . Not on file  Tobacco Use  . Smoking status: Former Smoker    Packs/day: 2.00    Years: 25.00    Pack years: 50.00    Types: Cigarettes    Quit date: 10/15/1971    Years since quitting: 49.2  . Smokeless tobacco: Never Used  Vaping Use  . Vaping Use: Never used  Substance and Sexual Activity  . Alcohol use: Not Currently  . Drug use: Never  . Sexual activity: Not on file  Other Topics Concern  . Not on file  Social History Narrative  . Not on file   Social Determinants of Health   Financial Resource Strain: Not on file  Food Insecurity: Not on file  Transportation Needs: Not on file  Physical Activity: Not on file  Stress: Not on file  Social Connections: Not on file  Intimate Partner Violence: Not on file   Family History  Problem Relation Age of Onset  . Colon cancer Neg Hx   . Esophageal cancer Neg Hx   . Pancreatic cancer Neg Hx   . Stomach cancer Neg Hx   . Heart disease Neg Hx   . Diabetes Neg Hx   . Inflammatory bowel disease Neg Hx   . Liver disease Neg Hx   . Rectal cancer Neg Hx    I have reviewed his medical, social, and family history in detail and updated the electronic medical record as necessary.    PHYSICAL EXAMINATION  BP (!) 86/52 (BP Location: Right Arm, Patient Position: Sitting, Cuff Size: Normal)   Pulse 86   Ht _0  (1.727 m)   Wt 139 lb 4 oz (63.2 kg)   BMI 21.17 kg/m  Wt Readings from Last 3 Encounters:  12/26/20 139 lb 4 oz (63.2 kg)  03/15/19 132  lb (59.9 kg)  11/09/18 141 lb (64 kg)  GEN: NAD, appears stated age, doesn't appear chronically ill PSYCH: Cooperative, without pressured speech EYE: Conjunctivae pink, sclerae anicteric ENT: MMM, without oral ulcers, no erythema or exudates noted CV: RR without R/Gs  RESP: No audible wheezing GI: NABS, soft, NT/ND, without rebound or guarding MSK/EXT: No lower extremity edema SKIN: No jaundice NEURO:  Alert & Oriented x 3, no focal deficits   REVIEW OF DATA  I reviewed the following data at the time of this encounter:  GI Procedures and Studies  Patient reports prior EGD/colonoscopy greater than 15 years ago  Laboratory Studies  Reviewed those in epic  and in outpatient records  August 2021 labs at [facility] Sodium 140 Potassium 4.0 BUN/creatinine 12/0.9 AST/ALT 21/60 Alk phos 66 Total bili 1.4 WBC 5.6 Hemoglobin/hematocrit 16/45.3 Platelets 161 MCV 89.5 TSH 1.12  Imaging Studies  No relevant studies to review   ASSESSMENT  Mr. Mcgarvey is a 87 y.o. male with a pmh significant for hypertension, arthritis, status post inguinal hernia repair, dementia, GERD, colon polyps, diverticulosis (with prior history of diverticulitis).  The patient is seen today for evaluation and management of:  1. Esophageal dysphagia   2. Gastroesophageal reflux disease, unspecified whether esophagitis present   3. History of colonic polyps    The patient is hemodynamically stable.  Clinically, he is overall stable but he has had episodes of solid food dysphagia.  Based on the patient's prior AVS history of esophageal dilations with good effect, repeat endoscopy is recommended in the setting of his longstanding nature of heartburn will ensure that he does not have any evidence of Barrett's esophagus since he is at risk of it.  We will want to rule out any sort of mass or malignancy as well including ruling out EOE and elevate.  The risks and benefits of endoscopic evaluation were  discussed with the patient; these include but are not limited to the risk of perforation, infection, bleeding, missed lesions, lack of diagnosis, severe illness requiring hospitalization, as well as anesthesia and sedation related illnesses.  The patient is agreeable to proceed.  All patient questions were answered to the best of my ability, and the patient agrees to the aforementioned plan of action with follow-up as indicated.   PLAN  Proceed with scheduling diagnostic endoscopy and possible dilation Continue omeprazole 20 mg daily Holding on repeat colonoscopy due to patient's age   Orders Placed This Encounter  Procedures  . Ambulatory referral to Gastroenterology    New Prescriptions   No medications on file   Modified Medications   No medications on file    Planned Follow Up No follow-ups on file.   Total Time in Face-to-Face and in Coordination of Care for patient including independent/personal interpretation/review of prior testing, medical history, examination, medication adjustment, communicating results with the patient directly, and documentation with the EHR is 35 minutes.   Justice Britain, MD [facility] Advanced Endoscopy Office # [phone]

## 2021-01-02 DIAGNOSIS — K219 Gastro-esophageal reflux disease without esophagitis: Secondary | ICD-10-CM | POA: Insufficient documentation

## 2021-01-02 DIAGNOSIS — Z86010 Personal history of colonic polyps: Secondary | ICD-10-CM | POA: Insufficient documentation

## 2021-01-02 DIAGNOSIS — R1310 Dysphagia, unspecified: Secondary | ICD-10-CM | POA: Insufficient documentation

## 2021-02-13 ENCOUNTER — Ambulatory Visit (AMBULATORY_SURGERY_CENTER): Payer: Medicare Other | Admitting: Gastroenterology

## 2021-02-13 ENCOUNTER — Encounter: Payer: Self-pay | Admitting: Gastroenterology

## 2021-02-13 ENCOUNTER — Other Ambulatory Visit: Payer: Self-pay

## 2021-02-13 VITALS — BP 143/75 | HR 68 | Temp 97.1°F | Resp 17 | Ht 68.0 in | Wt 139.0 lb

## 2021-02-13 DIAGNOSIS — K2970 Gastritis, unspecified, without bleeding: Secondary | ICD-10-CM

## 2021-02-13 DIAGNOSIS — K449 Diaphragmatic hernia without obstruction or gangrene: Secondary | ICD-10-CM

## 2021-02-13 DIAGNOSIS — K219 Gastro-esophageal reflux disease without esophagitis: Secondary | ICD-10-CM | POA: Diagnosis not present

## 2021-02-13 DIAGNOSIS — K2990 Gastroduodenitis, unspecified, without bleeding: Secondary | ICD-10-CM

## 2021-02-13 DIAGNOSIS — R1319 Other dysphagia: Secondary | ICD-10-CM | POA: Diagnosis not present

## 2021-02-13 DIAGNOSIS — K2980 Duodenitis without bleeding: Secondary | ICD-10-CM | POA: Diagnosis not present

## 2021-02-13 DIAGNOSIS — K2950 Unspecified chronic gastritis without bleeding: Secondary | ICD-10-CM | POA: Diagnosis not present

## 2021-02-13 HISTORY — PX: UPPER GASTROINTESTINAL ENDOSCOPY: SHX188

## 2021-02-13 HISTORY — PX: OTHER SURGICAL HISTORY: SHX169

## 2021-02-13 MED ORDER — OMEPRAZOLE 20 MG PO CPDR: 20.0000 mg | DELAYED_RELEASE_CAPSULE | Freq: Two times a day (BID) | ORAL | 3 refills | Status: DC

## 2021-02-13 MED ORDER — SODIUM CHLORIDE 0.9 % IV SOLN: 500.0000 mL | Freq: Once | INTRAVENOUS | Status: DC

## 2021-02-13 NOTE — Progress Notes (Signed)
pt tolerated well. VSS. awake and to recovery. Report given to RN. Bite guard left insitu to recovery.

## 2021-02-13 NOTE — Progress Notes (Signed)
Called to room to assist during endoscopic procedure.  Patient ID and intended procedure confirmed with present staff. Received instructions for my participation in the procedure from the performing physician.  

## 2021-02-13 NOTE — Op Note (Signed)
[facility] Patient Name: Brent Brennan Procedure Date: 02/13/2021 10:40 AM MRN: [id number] Endoscopist: Justice Britain , MD Age: 87 Referring MD:  Date of Birth: 1931/01/12 Gender: Male Account #: [id number] Procedure:                Upper GI endoscopy Indications:              Dysphagia, Heartburn, Gastro-esophageal reflux                            disease, Screening for Barrett's esophagus in                            patient at risk for this condition Medicines:                Monitored Anesthesia Care Procedure:                Pre-Anesthesia Assessment:                           - Prior to the procedure, a History and Physical                            was performed, and patient medications and                            allergies were reviewed. The patient's tolerance of                            previous anesthesia was also reviewed. The risks                            and benefits of the procedure and the sedation                            options and risks were discussed with the patient.                            All questions were answered, and informed consent                            was obtained. Prior Anticoagulants: The patient has                            taken no previous anticoagulant or antiplatelet                            agents except for aspirin. ASA Grade Assessment:                            III - A patient with severe systemic disease. After                            reviewing the risks and benefits, the patient was  deemed in satisfactory condition to undergo the                            procedure.                           After obtaining informed consent, the endoscope was                            passed under direct vision. Throughout the                            procedure, the patient's blood pressure, pulse, and                            oxygen saturations were monitored continuously. The                             Endoscope was introduced through the mouth, and                            advanced to the second part of duodenum. The upper                            GI endoscopy was accomplished without difficulty.                            The patient tolerated the procedure. Scope In: Scope Out: Findings:                 The distal esophagus was moderately tortuous.                           Normal mucosa was found in the entire esophagus                            otherwise. Biopsies were taken with a cold forceps                            for histology to rule out EoE/LoE.                           The Z-line was regular and was found 40 cm from the                            incisors.                           After the rest of the EGD was complete, a guidewire                            was placed and the scope was withdrawn. Dilation                            was performed in the  entire esophagus with a Savary                            dilator with mild resistance at 16 mm and moderate                            resistance at 17 mm. The esophagus was examined                            following endoscope reinsertion and showed mild                            mucosal disruption just below the UES.                           A 4 cm hiatal hernia was present.                           An angulation deformity and J-shaped stomach was                            found.                           Patchy moderately erythematous mucosa without                            bleeding was found in the entire examined stomach.                           No other gross lesions were noted in the entire                            examined stomach. Biopsies were taken with a cold                            forceps for histology and Helicobacter pylori                            testing.                           No gross lesions were noted in the duodenal bulb,                             in the first portion of the duodenum and in the                            second portion of the duodenum. Complications:            No immediate complications. Estimated Blood Loss:     Estimated blood loss was minimal. Impression:               - Tortuous esophagus distally.                           -  Normal mucosa was found in the entire esophagus.                            Biopsied for EoE.                           - Dilation performed in the entire esophagus with                            mild mucosal wrent just below the UES.                           - Z-line regular, 40 cm from the incisors.                           - 4 cm hiatal hernia.                           - Angulation and J-shaped deformity of the stomach                            noted.                           - Erythematous mucosa in the stomach. Biopsied.                           - No gross lesions in the duodenal bulb, in the                            first portion of the duodenum and in the second                            portion of the duodenum. Recommendation:           - The patient will be observed post-procedure,                            until all discharge criteria are met.                           - Discharge patient to home.                           - Patient has a contact number available for                            emergencies. The signs and symptoms of potential                            delayed complications were discussed with the                            patient. Return to normal activities tomorrow.  Written discharge instructions were provided to the                            patient.                           - Dilation diet as per protocol.                           - Please use Cepacol or Halls Lozenges +/-                            Chloraseptic spray for next 72-96 hours to aid in                            sore thoat should you experience  this.                           - Continue present medications.                           - Await pathology results.                           - If no significant improvement in symptoms for a                            sustained period in time, then would recommend                            Manometry as next steps in evaluation. If durable                            time of improvement and then recurrence, then                            repeat dilation to 17 mm or higher can be                            considered in future.                           - The findings and recommendations were discussed                            with the patient.                           - The findings and recommendations were discussed                            with the patient's family. Justice Britain, MD 02/13/2021 11:21:32 AM

## 2021-02-13 NOTE — Patient Instructions (Signed)
Handout given:  Gastritis, post dilation diet Please use cepacol or Halls for the next 3-4 days Continue current medications Await pathology results  YOU HAD AN ENDOSCOPIC PROCEDURE TODAY AT THE [facility]:   Refer to the procedure report that was given to you for any specific questions about what was found during the examination.  If the procedure report does not answer your questions, please call your gastroenterologist to clarify.  If you requested that your care partner not be given the details of your procedure findings, then the procedure report has been included in a sealed envelope for you to review at your convenience later.  YOU SHOULD EXPECT: Some feelings of bloating in the abdomen. Passage of more gas than usual.  Walking can help get rid of the air that was put into your GI tract during the procedure and reduce the bloating. If you had a lower endoscopy (such as a colonoscopy or flexible sigmoidoscopy) you may notice spotting of blood in your stool or on the toilet paper. If you underwent a bowel prep for your procedure, you may not have a normal bowel movement for a few days.  Please Note:  You might notice some irritation and congestion in your nose or some drainage.  This is from the oxygen used during your procedure.  There is no need for concern and it should clear up in a day or so.  SYMPTOMS TO REPORT IMMEDIATELY:   Following upper endoscopy (EGD)  Vomiting of blood or coffee ground material  New chest pain or pain under the shoulder blades  Painful or persistently difficult swallowing  New shortness of breath  Fever of 100F or higher  Black, tarry-looking stools  For urgent or emergent issues, a gastroenterologist can be reached at any hour by calling [phone]. Do not use MyChart messaging for urgent concerns.   DIET:  We do recommend a small meal at first, but then you may proceed to your regular diet.  Drink plenty of fluids but you should avoid  alcoholic beverages for 24 hours.  ACTIVITY:  You should plan to take it easy for the rest of today and you should NOT DRIVE or use heavy machinery until tomorrow (because of the sedation medicines used during the test).    FOLLOW UP: Our staff will call the number listed on your records 48-72 hours following your procedure to check on you and address any questions or concerns that you may have regarding the information given to you following your procedure. If we do not reach you, we will leave a message.  We will attempt to reach you two times.  During this call, we will ask if you have developed any symptoms of COVID 19. If you develop any symptoms (ie: fever, flu-like symptoms, shortness of breath, cough etc.) before then, please call [phone].  If you test positive for Covid 19 in the 2 weeks post procedure, please call and report this information to [place].    If any biopsies were taken you will be contacted by phone or by letter within the next 1-3 weeks.  Please call us at [phone] if you have not heard about the biopsies in 3 weeks.   SIGNATURES/CONFIDENTIALITY: You and/or your care partner have signed paperwork which will be entered into your electronic medical record.  These signatures attest to the fact that that the information above on your After Visit Summary has been reviewed and is understood.  Full responsibility of the confidentiality of this  discharge information lies with you and/or your care-partner. 

## 2021-02-13 NOTE — Progress Notes (Signed)
Pt's states no medical or surgical changes since previsit or office visit.  Vitals MO 

## 2021-02-15 ENCOUNTER — Telehealth: Payer: Self-pay | Admitting: *Deleted

## 2021-02-15 NOTE — Telephone Encounter (Signed)
  Follow up Call-  Call back number 02/13/2021  Post procedure Call Back phone  # [phone]- Daughter  Permission to leave phone message Yes  Some recent data might be hidden     Patient questions:  Do you have a fever, pain , or abdominal swelling? No. Pain Score  0 *  Have you tolerated food without any problems? Yes.    Have you been able to return to your normal activities? Yes.    Do you have any questions about your discharge instructions: Diet   No. Medications  No. Follow up visit  No.  Do you have questions or concerns about your Care? No.  Actions: * If pain score is 4 or above: 1. No action needed, pain <4.Have you developed a fever since your procedure? no  2.   Have you had an respiratory symptoms (SOB or cough) since your procedure? no  3.   Have you tested positive for COVID 19 since your procedure no  4.   Have you had any family members/close contacts diagnosed with the COVID 19 since your procedure?  no   If yes to any of these questions please route to Laverna Peace, RN and Karlton Lemon, RN

## 2021-02-20 ENCOUNTER — Ambulatory Visit: Payer: Medicare Other | Admitting: Neurology

## 2021-02-20 ENCOUNTER — Other Ambulatory Visit: Payer: Self-pay

## 2021-02-20 ENCOUNTER — Encounter: Payer: Self-pay | Admitting: Neurology

## 2021-02-20 VITALS — BP 154/81 | HR 79 | Ht 68.0 in | Wt 129.0 lb

## 2021-02-20 DIAGNOSIS — R519 Headache, unspecified: Secondary | ICD-10-CM | POA: Diagnosis not present

## 2021-02-20 MED ORDER — PREDNISONE 20 MG PO TABS: 60.0000 mg | ORAL_TABLET | Freq: Every day | ORAL | 0 refills | Status: DC

## 2021-02-20 NOTE — Progress Notes (Signed)
[facility]    Provider:  Dr Jaynee Eagles Requesting Provider: Prince Solian, MD Primary Care Provider:  Prince Solian, MD  CC:  Right-sided headaches  HPI:  Brent Brennan is a 87 y.o. male here as requested by Prince Solian, MD for daily headaches. PMHx hypertension, Gill Bears syndrome, GERD, diarrhea, dementia, arthritis.  I reviewed Dr. Danna Hefty notes: Patient's been having headaches for several months, he had his ears cleaned out and hearing aids checked at [facility], still having trouble hearing and having headaches every day, says in the middle of the head, coughs a little not a lot, blowing nose little, nonproductive cough, feels sinus pressure between the eyes and forehead rubs it says head hurts sometimes ears hurt to and feels pressure in the ear, they did get quite a bit of wax out, no fevers, shortness of breath, wheezing, chest tightness, takes Excedrin Migraine occasionally, helps only a little.  They treated him with Levaquin for resistant sinusitis for 7 days.  Looks like he is on prednisone daily.  Localized to the forehead with radiation to the cheeks, teeth, ears and neck, no big headache history.  He has had headaches in the past. He had a sinus infection in February and was treated and got better with the infection and the headache, this happened again. They saw the ENT and a tube was placed last Monday in his ear. He has his esophagus stretched. He had some jaw pin on the right. Starts on the right side of the head. It got better with the antibiotics. But worsening. It is on the right orbital and periorbital area. Kind of sharp at times, comes on after lunch or in the morning and lasts all day. Every day. He states no jaw pain today but daughter says he has had jaw pain. Radiates to the ear and also he has pain in the nack mostlyont he right side. No vision changes, no light or sound sensitivity, no nausea. Tylenol helps a little nothing triggers it, started  with the sinus infection. He says it is stable. Moderate in pain. No weakness, numbness or tingling, or focal weakness. He still has pain inside is ear but no congestion.    Reviewed notes, labs and imaging from outside physicians, which showed:   MRI brain 11/14/2020:  IMPRESSION: No intracranial mass, hemorrhage, or abnormal enhancement.  Chronic microvascular ischemic changes.  Small chronic infarcts.  Review of Systems: Patient complains of symptoms per HPI as well as the following symptoms: headache, ear pain. Pertinent negatives and positives per HPI. All others negative.   Social History   Socioeconomic History  . Marital status: Widowed    Spouse name: Not on file  . Number of children: 2  . Years of education: Not on file  . Highest education level: High school graduate  Occupational History  . Not on file  Tobacco Use  . Smoking status: Former Smoker    Packs/day: 2.00    Years: 25.00    Pack years: 50.00    Types: Cigarettes    Quit date: 10/15/1971    Years since quitting: 49.3  . Smokeless tobacco: Never Used  Vaping Use  . Vaping Use: Never used  Substance and Sexual Activity  . Alcohol use: Not Currently  . Drug use: Never  . Sexual activity: Not on file  Other Topics Concern  . Not on file  Social History Narrative   Daughter lives with patient   Right handed   Caffeine: 1 cup/day  Social Determinants of Health   Financial Resource Strain: Not on file  Food Insecurity: Not on file  Transportation Needs: Not on file  Physical Activity: Not on file  Stress: Not on file  Social Connections: Not on file  Intimate Partner Violence: Not on file    Family History  Problem Relation Age of Onset  . Hypertension Mother   . Diverticulitis Mother   . Other Mother        DJD  . Pneumonia Father   . Alzheimer's disease Father   . Cataracts Father   . Colon cancer Neg Hx   . Esophageal cancer Neg Hx   . Pancreatic cancer Neg Hx   . Stomach cancer  Neg Hx   . Heart disease Neg Hx   . Diabetes Neg Hx   . Inflammatory bowel disease Neg Hx   . Liver disease Neg Hx   . Rectal cancer Neg Hx   . Migraines Neg Hx   . Headache Neg Hx     Past Medical History:  Diagnosis Date  . Accidental fall from ladder    resulting in multiple L rib fracturs, traumatic small left hemopneumothorax, left transverse process fracture at T4, T5, and T8 with four-day hospitalization coupled K. by mild from cytopenia and mild hyperglycemia  . Arthritis   . Colon polyps   . Dementia ([place])   . Diarrhea    IBS, Dr Earlean Shawl- suspect malabsorption/maldigestion due to lactose intolerance give the amount of milk products consumed  . Diverticulitis   . GERD (gastroesophageal reflux disease)   . Gilbert's syndrome   . Hiatal hernia   . History of rib fracture 2015   fell from ladder  . Hypertension   . Left inguinal hernia 11/09/2018    Patient Active Problem List   Diagnosis Date Noted  . Right temporal headache 02/20/2021  . Dysphagia 01/02/2021  . Gastroesophageal reflux disease 01/02/2021  . History of colonic polyps 01/02/2021  . Left inguinal hernia 11/09/2018    Past Surgical History:  Procedure Laterality Date  . CATARACT EXTRACTION, BILATERAL    . CHOLECYSTECTOMY    . COLONOSCOPY    . esophagus stretch  02/13/2021  . EYE SURGERY    . HERNIA REPAIR     Right inguinal  . INGUINAL HERNIA REPAIR Left 11/09/2018   Procedure: OPEN REPAIR LEFT INGUINAL HERNIA WITH MESH ERAS PATHWAY;  Surgeon: Fanny Skates, MD;  Location: [facility];  Service: General;  Laterality: Left;  TAP BLOCK  . UPPER GASTROINTESTINAL ENDOSCOPY    . UPPER GASTROINTESTINAL ENDOSCOPY  02/13/2021    Current Outpatient Medications  Medication Sig Dispense Refill  . Ascorbic Acid (VITAMIN C) 1000 MG tablet Take 1,000 mg by mouth daily.    Marland Kitchen aspirin EC 81 MG tablet Take 81 mg by mouth daily.    . calcium carbonate (OSCAL) 1500 (600 Ca) MG TABS tablet Take 600 mg of elemental  calcium by mouth daily with breakfast.    . Cholecalciferol (VITAMIN D3) 50 MCG (2000 UT) capsule Take 2,000 Units by mouth daily.    Marland Kitchen escitalopram (LEXAPRO) 10 MG tablet Take 10 mg by mouth daily.    . hydrochlorothiazide (HYDRODIURIL) 25 MG tablet Take 25 mg by mouth daily.    . memantine (NAMENDA) 10 MG tablet Take 10 mg by mouth 2 (two) times daily.    . mirtazapine (REMERON) 15 MG tablet Take 15 mg by mouth at bedtime as needed.    . Multiple Vitamin (MULTIVITAMIN WITH  MINERALS) TABS tablet Take 1 tablet by mouth daily.    . Multiple Vitamins-Minerals (PRESERVISION AREDS 2 PO) Take 1 tablet by mouth 2 (two) times daily.    Marland Kitchen omeprazole (PRILOSEC) 20 MG capsule Take 1 capsule (20 mg total) by mouth 2 (two) times daily before a meal. 90 capsule 3  . Potassium 99 MG TABS Take 99 mg by mouth daily.    . predniSONE (DELTASONE) 20 MG tablet Take 3 tablets (60 mg total) by mouth daily. Take first dose as soon as possible. Take the other doses in the morning. Always take with food. 15 tablet 0  . traMADol (ULTRAM) 50 MG tablet Take 1 tablet (50 mg total) by mouth every 6 (six) hours as needed for severe pain (Only if needed). 15 tablet 0   No current facility-administered medications for this visit.    Allergies as of 02/20/2021 - Review Complete 02/20/2021  Allergen Reaction Noted  . Aricept [donepezil]  02/20/2021  . Statins  02/20/2021    Vitals: BP (!) 154/81 (BP Location: Left Arm, Patient Position: Sitting)   Pulse 79   Ht 5' 8" (1.727 m)   Wt 129 lb (58.5 kg)   BMI 19.61 kg/m  Last Weight:  Wt Readings from Last 1 Encounters:  02/20/21 129 lb (58.5 kg)   Last Height:   Ht Readings from Last 1 Encounters:  02/20/21 5' 8" (1.727 m)     Physical exam: Exam: Gen: NAD, conversant, well nourised, well groomed                     CV: RRR, no MRG. No Carotid Bruits. No peripheral edema, warm, nontender Eyes: Conjunctivae clear without exudates or hemorrhage  Neuro: Detailed  Neurologic Exam  Speech:    Speech is normal; fluent and spontaneous with normal comprehension.  Cognition:    The patient is oriented to person, place, and time;     recent and remote memory intact;     language fluent;     normal attention, concentration,     fund of knowledge Cranial Nerves:    The pupils are equal, round, and reactive to light. The fundi are flat. Visual fields are full to finger confrontation. Extraocular movements are intact. Trigeminal sensation is intact and the muscles of mastication are normal. The face is symmetric. The palate elevates in the midline. Hearing intact. Voice is normal. Shoulder shrug is normal. The tongue has normal motion without fasciculations.   Coordination:    Normal finger to nose   Gait:    Good stride and stance  Motor Observation:    No asymmetry, no atrophy, and no involuntary movements noted. Tone:    Normal muscle tone.    Posture:    Posture is normal. normal erect    Strength:    Strength is V/V in the upper and lower limbs.      Sensation: intact to LT     Reflex Exam:  DTR's:    Deep tendon reflexes in the upper and lower extremities are brisk bilaterally.   Toes:    The toes are downgoing bilaterally.   Clonus:    Clonus is absent.    Assessment/Plan:   87 y.o. male here as requested by Prince Solian, MD for daily headaches. PMHx hypertension,  GERD, diarrhea, dementia, arthritis. Patient has been having right sided orbital/periorbital pain radiating from is right forehead to the right ear and down the neck for several months, daily, intractable, taking tylenol  every day.  The headache started in the setting of a sinus infection and he recently had an ear tube placed in the right ear, could be related to inflammation secondary to this however always have to rule out temporal arteritis first.  -We will start him on 60 mg steroids while awaiting ESR and CRP. -If ESR and CRP are elevated recommend temporal artery  biopsy.  If not, we may want him to just finish 5 days of steroids and see if that helps if this is some sort of secondary inflammation after the sinus infection. -Recommended eye doctor appointment to ensure nothing going on with that right eye, sometimes acute glaucoma can cause headaches, however he reports no vision changes. -MRI of the brain was unremarkable, could consider repeating but will see how the above goes first.  Orders Placed This Encounter  Procedures  . Sedimentation rate  . C-reactive protein  . CBC with Differential/Platelets  . Comprehensive metabolic panel  . TSH   Meds ordered this encounter  Medications  . predniSONE (DELTASONE) 20 MG tablet    Sig: Take 3 tablets (60 mg total) by mouth daily. Take first dose as soon as possible. Take the other doses in the morning. Always take with food.    Dispense:  15 tablet    Refill:  0    Cc: Prince Solian, MD,  Prince Solian, MD  Sarina Ill, MD  [facility] [address] [place], [place] [address]  Phone [phone] Fax [phone]

## 2021-02-20 NOTE — Patient Instructions (Addendum)
Start Steroids Wait blood work to return May consider temporal artery biopsy if needed or indicated   Temporal Arteritis  Temporal arteritis is a condition that causes arteries to become inflamed. It usually affects arteries in your head and face, but arteries in any part of the body can become inflamed. The condition is also called giant cell arteritis.  Temporal arteritis can cause serious problems such as blindness. Early treatment can help prevent these problems. What are the causes? The cause of this condition is not known. What increases the risk? The following factors may make you more likely to develop this condition:  Being older than 50.  Being a woman.  Being Caucasian.  Being of [place], [place], [place], [place], or [place] ancestry.  Having a family history of the condition.  Having a certain condition that causes muscle pain and stiffness (polymyalgia rheumatica, PMR). What are the signs or symptoms? Some people with temporal arteritis have just one symptom, while others have several symptoms. Most symptoms are related to the head and face. These may include:  Headache.  Hard or swollen temples. This is common. Your temples are the areas on either side of your forehead. If your temples are swollen, it may hurt to touch them.  Pain when combing your hair or when laying your head down.  Pain in the jaw when chewing.  Pain in the throat or tongue.  Problems with your vision, such as sudden loss of vision in one eye, or seeing double. Other symptoms may include:  Fever.  Tiredness (fatigue).  A dry cough.  Pain in the hips and shoulders.  Pain in the arms during exercise.  Depression.  Weight loss. How is this diagnosed? This condition may be diagnosed based on:  Your symptoms.  Your medical history.  A physical exam.  Tests, including: ? Blood tests. ? A test in which a tissue sample is removed from an artery so it can be examined  (biopsy). ? Imaging tests, such as an ultrasound or MRI. How is this treated? This condition may be treated with:  A type of medicine to reduce inflammation (corticosteroid).  Medicines to weaken your immune system (immunosuppressants).  Other medicines to treat vision problems. You will need to see your health care provider while you are being treated. The medicines used to treat this condition can increase your risk of problems such as bone loss and diabetes. During follow-up visits, your health care provider will check for problems by:  Doing blood tests and bone density tests.  Checking your blood pressure and blood sugar. Follow these instructions at home: Medicines  Take over-the-counter and prescription medicines only as told by your health care provider.  Take any vitamins or supplements recommended by your health care provider. These may include vitamin D and calcium, which help keep your bones from becoming weak. Eating and drinking  Eat a heart-healthy diet. This may include: ? Eating high-fiber foods, such as fresh fruits and vegetables, whole grains, and beans. ? Eating heart-healthy fats (omega-3 fats), such as fish, flaxseed, and flaxseed oil. ? Limiting foods that are high in saturated fat and cholesterol, such as processed and fried foods, fatty meat, and full-fat dairy. ? Limiting how much salt (sodium) you eat.  Include calcium and vitamin D in your diet. Good sources of calcium and vitamin D include: ? Low-fat dairy products such as milk, yogurt, and cheese. ? Certain fish, such as fresh or canned salmon, tuna, and sardines. ? Products that have calcium and  vitamin D added to them (fortified products), such as fortified cereals or juice.   General instructions  Exercise. Talk with your health care provider about what exercises are okay for you. Exercises that increase your heart rate (aerobic exercise), such as walking, are often recommended. Aerobic exercise  helps control your blood pressure and prevent bone loss.  Stay up to date on all vaccines as directed by your health care provider.  Keep all follow-up visits as told by your health care provider. This is important. Contact a health care provider if:  Your symptoms get worse.  You develop signs of infection, such as fever, swelling, redness, warmth, and tenderness. Get help right away if:  You lose your vision.  Your pain does not go away, even after you take medicine.  You have chest pain.  You have trouble breathing.  One side of your face or body suddenly becomes weak or numb. These symptoms may represent a serious problem that is an emergency. Do not wait to see if the symptoms will go away. Get medical help right away. Call your local emergency services (911 in the [place] Do not drive yourself to the hospital. Summary  Temporal arteritis is a condition that causes arteries to become inflamed. It usually affects arteries in your head and face.  This condition can cause serious problems, such as blindness. Treatment can help prevent these problems.  Symptoms may include hard or tender temples, pain in your jaw when chewing, problems with your vision, or pain in your hips and shoulders.  Take over-the-counter and prescription medicines as told by your health care provider. This information is not intended to replace advice given to you by your health care provider. Make sure you discuss any questions you have with your health care provider. Document Revised: 11/13/2017 Document Reviewed: 11/11/2017 Elsevier Patient Education  2021 Elsevier Inc.  Prednisone tablets What is this medicine? PREDNISONE (PRED ni sone) is a corticosteroid. It is commonly used to treat inflammation of the skin, joints, lungs, and other organs. Common conditions treated include asthma, allergies, and arthritis. It is also used for other conditions, such as blood disorders and diseases of the adrenal  glands. This medicine may be used for other purposes; ask your health care provider or pharmacist if you have questions. COMMON BRAND NAME(S): Deltasone, Predone, Sterapred, Sterapred DS What should I tell my health care provider before I take this medicine? They need to know if you have any of these conditions:  Cushing's syndrome  diabetes  glaucoma  heart disease  high blood pressure  infection (especially a virus infection such as chickenpox, cold sores, or herpes)  kidney disease  liver disease  mental illness  myasthenia gravis  osteoporosis  seizures  stomach or intestine problems  thyroid disease  an unusual or allergic reaction to lactose, prednisone, other medicines, foods, dyes, or preservatives  pregnant or trying to get pregnant  breast-feeding How should I use this medicine? Take this medicine by mouth with a glass of water. Follow the directions on the prescription label. Take this medicine with food. If you are taking this medicine once a day, take it in the morning. Do not take more medicine than you are told to take. Do not suddenly stop taking your medicine because you may develop a severe reaction. Your doctor will tell you how much medicine to take. If your doctor wants you to stop the medicine, the dose may be slowly lowered over time to avoid any side effects. Talk  to your pediatrician regarding the use of this medicine in children. Special care may be needed. Overdosage: If you think you have taken too much of this medicine contact a poison control center or emergency room at once. NOTE: This medicine is only for you. Do not share this medicine with others. What if I miss a dose? If you miss a dose, take it as soon as you can. If it is almost time for your next dose, talk to your doctor or health care professional. You may need to miss a dose or take an extra dose. Do not take double or extra doses without advice. What may interact with this  medicine? Do not take this medicine with any of the following medications:  metyrapone  mifepristone This medicine may also interact with the following medications:  aminoglutethimide  amphotericin B  aspirin and aspirin-like medicines  barbiturates  certain medicines for diabetes, like glipizide or glyburide  cholestyramine  cholinesterase inhibitors  cyclosporine  digoxin  diuretics  ephedrine  male hormones, like estrogens and birth control pills  isoniazid  ketoconazole  NSAIDS, medicines for pain and inflammation, like ibuprofen or naproxen  phenytoin  rifampin  toxoids  vaccines  warfarin This list may not describe all possible interactions. Give your health care provider a list of all the medicines, herbs, non-prescription drugs, or dietary supplements you use. Also tell them if you smoke, drink alcohol, or use illegal drugs. Some items may interact with your medicine. What should I watch for while using this medicine? Visit your doctor or health care professional for regular checks on your progress. If you are taking this medicine over a prolonged period, carry an identification card with your name and address, the type and dose of your medicine, and your doctor's name and address. This medicine may increase your risk of getting an infection. Tell your doctor or health care professional if you are around anyone with measles or chickenpox, or if you develop sores or blisters that do not heal properly. If you are going to have surgery, tell your doctor or health care professional that you have taken this medicine within the last twelve months. Ask your doctor or health care professional about your diet. You may need to lower the amount of salt you eat. This medicine may increase blood sugar. Ask your healthcare provider if changes in diet or medicines are needed if you have diabetes. What side effects may I notice from receiving this medicine? Side  effects that you should report to your doctor or health care professional as soon as possible:  allergic reactions like skin rash, itching or hives, swelling of the face, lips, or tongue  changes in emotions or moods  changes in vision  depressed mood  eye pain  fever or chills, cough, sore throat, pain or difficulty passing urine  signs and symptoms of high blood sugar such as being more thirsty or hungry or having to urinate more than normal. You may also feel very tired or have blurry vision.  swelling of ankles, feet Side effects that usually do not require medical attention (report to your doctor or health care professional if they continue or are bothersome):  confusion, excitement, restlessness  headache  nausea, vomiting  skin problems, acne, thin and shiny skin  trouble sleeping  weight gain This list may not describe all possible side effects. Call your doctor for medical advice about side effects. You may report side effects to FDA at 1-800-FDA-1088. Where should I keep my  medicine? Keep out of the reach of children. Store at room temperature between 15 and 30 degrees C (59 and 86 degrees F). Protect from light. Keep container tightly closed. Throw away any unused medicine after the expiration date. NOTE: This sheet is a summary. It may not cover all possible information. If you have questions about this medicine, talk to your doctor, pharmacist, or health care provider.  2021 Elsevier/Gold Standard (2018-06-30 10:54:22)

## 2021-02-21 ENCOUNTER — Encounter: Payer: Self-pay | Admitting: Gastroenterology

## 2021-02-21 LAB — COMPREHENSIVE METABOLIC PANEL
ALT: 11 IU/L (ref 0–44)
AST: 19 IU/L (ref 0–40)
Albumin/Globulin Ratio: 2 (ref 1.2–2.2)
Albumin: 4.3 g/dL (ref 3.6–4.6)
Alkaline Phosphatase: 92 IU/L (ref 44–121)
BUN/Creatinine Ratio: 14 (ref 10–24)
BUN: 10 mg/dL (ref 8–27)
Bilirubin Total: 0.4 mg/dL (ref 0.0–1.2)
CO2: 28 mmol/L (ref 20–29)
Calcium: 9.5 mg/dL (ref 8.6–10.2)
Chloride: 94 mmol/L — ABNORMAL LOW (ref 96–106)
Creatinine, Ser: 0.72 mg/dL — ABNORMAL LOW (ref 0.76–1.27)
Globulin, Total: 2.2 g/dL (ref 1.5–4.5)
Glucose: 106 mg/dL — ABNORMAL HIGH (ref 65–99)
Potassium: 3.8 mmol/L (ref 3.5–5.2)
Sodium: 139 mmol/L (ref 134–144)
Total Protein: 6.5 g/dL (ref 6.0–8.5)
eGFR: 88 mL/min/{1.73_m2} (ref >59)

## 2021-02-21 LAB — CBC WITH DIFFERENTIAL/PLATELET
Basophils Absolute: 0 10*3/uL (ref 0.0–0.2)
Basos: 0 %
EOS (ABSOLUTE): 0.1 10*3/uL (ref 0.0–0.4)
Eos: 1 %
Hematocrit: 41.3 % (ref 37.5–51.0)
Hemoglobin: 13.6 g/dL (ref 13.0–17.7)
Immature Grans (Abs): 0 10*3/uL (ref 0.0–0.1)
Immature Granulocytes: 0 %
Lymphocytes Absolute: 1.2 10*3/uL (ref 0.7–3.1)
Lymphs: 14 %
MCH: 29.2 pg (ref 26.6–33.0)
MCHC: 32.9 g/dL (ref 31.5–35.7)
MCV: 89 fL (ref 79–97)
Monocytes Absolute: 0.5 10*3/uL (ref 0.1–0.9)
Monocytes: 6 %
Neutrophils Absolute: 6.7 10*3/uL (ref 1.4–7.0)
Neutrophils: 79 %
Platelets: 247 10*3/uL (ref 150–450)
RBC: 4.65 x10E6/uL (ref 4.14–5.80)
RDW: 12.9 % (ref 11.6–15.4)
WBC: 8.5 10*3/uL (ref 3.4–10.8)

## 2021-02-21 LAB — C-REACTIVE PROTEIN: CRP: 8 mg/L (ref 0–10)

## 2021-02-21 LAB — SEDIMENTATION RATE: Sed Rate: 2 mm/hr (ref 0–30)

## 2021-02-21 LAB — TSH: TSH: 1.07 u[IU]/mL (ref 0.450–4.500)

## 2021-02-22 ENCOUNTER — Telehealth: Payer: Self-pay | Admitting: *Deleted

## 2021-02-22 NOTE — Telephone Encounter (Signed)
-----   Message from Anson Fret, MD sent at 02/21/2021  5:08 PM EDT ----- Toma Copier: Give patient a call please. Sed rate and crp are normal so that make temporal arteritis very unlikely. Still I would like him to complete the 5 days of steroids and see if that helps. And I would like to see him back in 2-3 weeks if we can make him an appointment  thanks

## 2021-02-22 NOTE — Telephone Encounter (Signed)
I spoke with patient's daughter Brent Brennan (on [place]) who was also here with him in the office at his appt. we discussed the lab results per Dr. Daisy Blossom.  She is aware that the sed rate and CRP are both normal which makes temporal arteritis very unlikely.  She is aware the patient should still complete the 5 days of steroids and see if that helps.  Dr. Lucia Gaskins would like to see him back in 2-3 weeks.  I let her know I would have to call her back about scheduling an appointment as the schedule is currently full. Melissa stated their availability is very open. She mentioned the patient's eyes are burning. He is not complaining of any vision problems.  She questions if this is allergies/pollen. I advised her to treat as she would over-the-counter but if things worsen or he does not improve he should call primary care or his eye doctor if he has one.  She verbalized appreciation for the call

## 2021-02-23 ENCOUNTER — Emergency Department (HOSPITAL_COMMUNITY)
Admission: EM | Admit: 2021-02-23 | Discharge: 2021-02-23 | Disposition: A | Discharge status: Home / Self Care | Payer: Medicare Other | Attending: Emergency Medicine | Admitting: Emergency Medicine

## 2021-02-23 ENCOUNTER — Other Ambulatory Visit: Payer: Self-pay

## 2021-02-23 ENCOUNTER — Emergency Department (HOSPITAL_COMMUNITY): Payer: Medicare Other

## 2021-02-23 ENCOUNTER — Encounter (HOSPITAL_COMMUNITY): Payer: Self-pay | Admitting: *Deleted

## 2021-02-23 DIAGNOSIS — Z7982 Long term (current) use of aspirin: Secondary | ICD-10-CM | POA: Insufficient documentation

## 2021-02-23 DIAGNOSIS — I10 Essential (primary) hypertension: Secondary | ICD-10-CM | POA: Insufficient documentation

## 2021-02-23 DIAGNOSIS — Z87891 Personal history of nicotine dependence: Secondary | ICD-10-CM | POA: Insufficient documentation

## 2021-02-23 DIAGNOSIS — F0390 Unspecified dementia without behavioral disturbance: Secondary | ICD-10-CM | POA: Diagnosis not present

## 2021-02-23 DIAGNOSIS — Z79899 Other long term (current) drug therapy: Secondary | ICD-10-CM | POA: Insufficient documentation

## 2021-02-23 DIAGNOSIS — R519 Headache, unspecified: Secondary | ICD-10-CM | POA: Diagnosis not present

## 2021-02-23 DIAGNOSIS — G510 Bell's palsy: Secondary | ICD-10-CM | POA: Insufficient documentation

## 2021-02-23 DIAGNOSIS — H9201 Otalgia, right ear: Secondary | ICD-10-CM | POA: Diagnosis present

## 2021-02-23 LAB — CBC WITH DIFFERENTIAL/PLATELET
Abs Immature Granulocytes: 0.04 10*3/uL (ref 0.00–0.07)
Basophils Absolute: 0 10*3/uL (ref 0.0–0.1)
Basophils Relative: 0 %
Eosinophils Absolute: 0 10*3/uL (ref 0.0–0.5)
Eosinophils Relative: 0 %
HCT: 43.9 % (ref 39.0–52.0)
Hemoglobin: 14.1 g/dL (ref 13.0–17.0)
Immature Granulocytes: 1 %
Lymphocytes Relative: 7 %
Lymphs Abs: 0.5 10*3/uL — ABNORMAL LOW (ref 0.7–4.0)
MCH: 29.6 pg (ref 26.0–34.0)
MCHC: 32.1 g/dL (ref 30.0–36.0)
MCV: 92 fL (ref 80.0–100.0)
Monocytes Absolute: 0.1 10*3/uL (ref 0.1–1.0)
Monocytes Relative: 2 %
Neutro Abs: 7.5 10*3/uL (ref 1.7–7.7)
Neutrophils Relative %: 90 %
Platelets: 205 10*3/uL (ref 150–400)
RBC: 4.77 MIL/uL (ref 4.22–5.81)
RDW: 13.4 % (ref 11.5–15.5)
WBC: 8.2 10*3/uL (ref 4.0–10.5)
nRBC: 0 % (ref 0.0–0.2)

## 2021-02-23 LAB — BASIC METABOLIC PANEL
Anion gap: 9 (ref 5–15)
BUN: 11 mg/dL (ref 8–23)
CO2: 34 mmol/L — ABNORMAL HIGH (ref 22–32)
Calcium: 9.6 mg/dL (ref 8.9–10.3)
Chloride: 94 mmol/L — ABNORMAL LOW (ref 98–111)
Creatinine, Ser: 0.82 mg/dL (ref 0.61–1.24)
GFR, Estimated: >60 mL/min (ref >60)
Glucose, Bld: 207 mg/dL — ABNORMAL HIGH (ref 70–99)
Potassium: 3.7 mmol/L (ref 3.5–5.1)
Sodium: 137 mmol/L (ref 135–145)

## 2021-02-23 MED ORDER — ARTIFICIAL TEARS OPHTHALMIC OINT
1.0000 "application " | TOPICAL_OINTMENT | Freq: Once | OPHTHALMIC | Status: AC
  Administered 2021-02-23: 1 via OPHTHALMIC
  Filled 2021-02-23 (×2): qty 3.5

## 2021-02-23 MED ORDER — METOCLOPRAMIDE HCL 5 MG/ML IJ SOLN
10.0000 mg | Freq: Once | INTRAMUSCULAR | Status: AC
  Administered 2021-02-23: 10 mg via INTRAVENOUS
  Filled 2021-02-23: qty 2

## 2021-02-23 MED ORDER — ARTIFICIAL TEARS OPHTHALMIC OINT: TOPICAL_OINTMENT | OPHTHALMIC | 0 refills | Status: DC | PRN

## 2021-02-23 MED ORDER — VALACYCLOVIR HCL 1 G PO TABS: 1000.0000 mg | ORAL_TABLET | Freq: Three times a day (TID) | ORAL | 0 refills | Status: AC

## 2021-02-23 MED ORDER — SODIUM CHLORIDE 0.9 % IV BOLUS
500.0000 mL | Freq: Once | INTRAVENOUS | Status: AC
  Administered 2021-02-23: 500 mL via INTRAVENOUS

## 2021-02-23 MED ORDER — DIPHENHYDRAMINE HCL 50 MG/ML IJ SOLN
12.5000 mg | Freq: Once | INTRAMUSCULAR | Status: AC
  Administered 2021-02-23: 12.5 mg via INTRAVENOUS
  Filled 2021-02-23: qty 1

## 2021-02-23 MED ORDER — VALACYCLOVIR HCL 500 MG PO TABS
1000.0000 mg | ORAL_TABLET | Freq: Once | ORAL | Status: AC
  Administered 2021-02-23: 1000 mg via ORAL
  Filled 2021-02-23 (×2): qty 2

## 2021-02-23 NOTE — Discharge Instructions (Signed)
You have Bell's palsy.  Please take Valtrex as prescribed.  Please finish your steroids as prescribed by your doctor  You need to follow-up with your neurologist this week.  You need to wear eye patch at night or tape your eye so it does not dry out.  Please try Lacri-Lube ointment to help keep your eye from drying out  Return to ER if you have worse weakness or trouble speaking or headaches or fever

## 2021-02-23 NOTE — ED Provider Notes (Signed)
[facility] EMERGENCY DEPARTMENT Provider Note   CSN: [id number] Arrival date & time: 02/23/21  0940     History Chief Complaint  Patient presents with  . Headache    Brent Brennan is a 87 y.o. male hx of Dementia, GERD,  Here with headache, R eye pain.  Patient states that he has right-sided eye pain and headache for the last 3 to 4 days.  Patient was seen by neurology on 5/10.  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  Patient was started on a course of steroids.  His ESR and CRP were drawn and was normal.  He states that since the last several days, he noticed some more pain in the right eye.  His eye became more red.  Also noticed some right facial droop.  Denies any arm or leg weakness.  Denies any trouble speaking  The history is provided by the patient.       Past Medical History:  Diagnosis Date  . Accidental fall from ladder    resulting in multiple L rib fracturs, traumatic small left hemopneumothorax, left transverse process fracture at T4, T5, and T8 with four-day hospitalization coupled K. by mild from cytopenia and mild hyperglycemia  . Arthritis   . Colon polyps   . Dementia (HCC)   . Diarrhea    IBS, Dr Medoff- suspect malabsorption/maldigestion due to lactose intolerance give the amount of milk products consumed  . Diverticulitis   . GERD (gastroesophageal reflux disease)   . Gilbert's syndrome   . Hiatal hernia   . History of rib fracture 2015   fell from ladder  . Hypertension   . Left inguinal hernia 11/09/2018    Patient Active Problem List   Diagnosis Date Noted  . Right temporal headache 02/20/2021  . Dysphagia 01/02/2021  . Gastroesophageal reflux disease 01/02/2021  . History of colonic polyps 01/02/2021  . Left inguinal hernia 11/09/2018    Past Surgical History:  Procedure Laterality Date  . CATARACT EXTRACTION, BILATERAL    . CHOLECYSTECTOMY    . COLONOSCOPY    . esophagus stretch  02/13/2021  . EYE SURGERY     . HERNIA REPAIR     Right inguinal  . INGUINAL HERNIA REPAIR Left 11/09/2018   Procedure: OPEN REPAIR LEFT INGUINAL HERNIA WITH MESH ERAS PATHWAY;  Surgeon: Ingram, Haywood, MD;  Location: [facility] OR;  Service: General;  Laterality: Left;  TAP BLOCK  . UPPER GASTROINTESTINAL ENDOSCOPY    . UPPER GASTROINTESTINAL ENDOSCOPY  02/13/2021       Family History  Problem Relation Age of Onset  . Hypertension Mother   . Diverticulitis Mother   . Other Mother        DJD  . Pneumonia Father   . Alzheimer's disease Father   . Cataracts Father   . Colon cancer Neg Hx   . Esophageal cancer Neg Hx   . Pancreatic cancer Neg Hx   . Stomach cancer Neg Hx   . Heart disease Neg Hx   . Diabetes Neg Hx   . Inflammatory bowel disease Neg Hx   . Liver disease Neg Hx   . Rectal cancer Neg Hx   . Migraines Neg Hx   . Headache Neg Hx     Social History   Tobacco Use  . Smoking status: Former Smoker    Packs/day: 2.00    Years: 25.00    Pack years: 50.00    Types: Cigarettes    Quit date:   10/15/1971    Years since quitting: 49.3  . Smokeless tobacco: Never Used  Vaping Use  . Vaping Use: Never used  Substance Use Topics  . Alcohol use: Not Currently  . Drug use: Never    Home Medications Prior to Admission medications   Medication Sig Start Date End Date Taking? Authorizing Provider  Ascorbic Acid (VITAMIN C) 1000 MG tablet Take 1,000 mg by mouth daily.    [provider]  aspirin EC 81 MG tablet Take 81 mg by mouth daily.    [provider]  calcium carbonate (OSCAL) 1500 (600 Ca) MG TABS tablet Take 600 mg of elemental calcium by mouth daily with breakfast.    [provider]  Cholecalciferol (VITAMIN D3) 50 MCG (2000 UT) capsule Take 2,000 Units by mouth daily.    [provider]  escitalopram (LEXAPRO) 10 MG tablet Take 10 mg by mouth daily. 12/18/20   [provider]  hydrochlorothiazide (HYDRODIURIL) 25 MG tablet Take 25 mg by mouth daily.  08/31/18   [provider]  memantine (NAMENDA) 10 MG tablet Take 10 mg by mouth 2 (two) times daily. 10/09/18   [provider]  mirtazapine (REMERON) 15 MG tablet Take 15 mg by mouth at bedtime as needed. 10/15/20   [provider]  Multiple Vitamin (MULTIVITAMIN WITH MINERALS) TABS tablet Take 1 tablet by mouth daily.    [provider]  Multiple Vitamins-Minerals (PRESERVISION AREDS 2 PO) Take 1 tablet by mouth 2 (two) times daily.    [provider]  omeprazole (PRILOSEC) 20 MG capsule Take 1 capsule (20 mg total) by mouth 2 (two) times daily before a meal. 02/13/21   Mansouraty, Telford Nab., MD  Potassium 99 MG TABS Take 99 mg by mouth daily.    [provider]  predniSONE (DELTASONE) 20 MG tablet Take 3 tablets (60 mg total) by mouth daily. Take first dose as soon as possible. Take the other doses in the morning. Always take with food. 02/20/21   Melvenia Beam, MD  traMADol (ULTRAM) 50 MG tablet Take 1 tablet (50 mg total) by mouth every 6 (six) hours as needed for severe pain (Only if needed). 11/09/18   Fanny Skates, MD    Allergies    Aricept [donepezil] and Statins  Review of Systems   Review of Systems  Neurological: Positive for headaches.  All other systems reviewed and are negative.   Physical Exam Updated Vital Signs BP (!) 145/74 (BP Location: Left Arm)   Pulse 79   Temp 98.9 F (37.2 C) (Oral)   Resp 16   Ht 5' 8" (1.727 m)   Wt 59.9 kg   SpO2 98%   BMI 20.07 kg/m   Physical Exam Vitals and nursing note reviewed.  Constitutional:      Comments: Chronically ill  HENT:     Head: Normocephalic.     Mouth/Throat:     Mouth: Mucous membranes are moist.  Eyes:     Comments: Patient unable to close right eye.  Patient has obvious right facial droop.  Cardiovascular:     Rate and Rhythm: Normal rate and regular rhythm.     Heart sounds: Normal heart sounds.  Pulmonary:     Effort: Pulmonary effort is  normal.     Breath sounds: Normal breath sounds.  Abdominal:     General: Bowel sounds are normal.     Palpations: Abdomen is soft.  Musculoskeletal:        General:  Normal range of motion.     Cervical back: Normal range of motion and neck supple.  Skin:    General: Skin is warm.  Neurological:     Mental Status: He is alert and oriented to person, place, and time.     Comments: Patient has obvious right facial droop and also unable to close right eye.  Patient has no obvious vesicular rash on the face patient has normal strength and sensation bilateral arms and legs.  Psychiatric:        Mood and Affect: Mood normal.        Behavior: Behavior normal.     ED Results / Procedures / Treatments   Labs (all labs ordered are listed, but only abnormal results are displayed) Labs Reviewed  CBC WITH DIFFERENTIAL/PLATELET - Abnormal; Notable for the following components:      Result Value   Lymphs Abs 0.5 (*)    All other components within normal limits  BASIC METABOLIC PANEL - Abnormal; Notable for the following components:   Chloride 94 (*)    CO2 34 (*)    Glucose, Bld 207 (*)    All other components within normal limits    EKG None  Radiology CT Head Wo Contrast  Result Date: 02/23/2021 CLINICAL DATA:  Dizziness, headache for several months. Current headache and RIGHT eye pain. EXAM: CT HEAD WITHOUT CONTRAST TECHNIQUE: Contiguous axial images were obtained from the base of the skull through the vertex without intravenous contrast. COMPARISON:  Brain MRI dated 11/14/2020 FINDINGS: Brain: Ventricles appear stable in size and configuration. Mild chronic small vessel ischemic changes noted within the deep periventricular white matter regions bilaterally. No mass, hemorrhage, edema or other evidence of acute parenchymal abnormality. No extra-axial hemorrhage. Vascular: Chronic calcified atherosclerotic changes of the large vessels at the skull base. No unexpected hyperdense vessel.  Skull: Normal. Negative for fracture or focal lesion. Sinuses/Orbits: Paranasal sinuses are clear. Periorbital and retro-orbital soft tissues are unremarkable Other: Fluid within the bilateral mastoid air cells, RIGHT greater than LEFT, of uncertain chronicity but not seen on earlier MRI of 11/14/2020. IMPRESSION: 1. No acute intracranial abnormality. No intracranial mass, hemorrhage or edema. 2. Mild chronic small vessel ischemic changes in the white matter. 3. Fluid within the bilateral mastoid air cells, RIGHT greater than LEFT, of uncertain chronicity but not seen on earlier MRI of 11/14/2020, raising the possibility of acute mastoiditis. Electronically Signed   By: Franki Cabot M.D.   On: 02/23/2021 17:03    Procedures Procedures   Medications Ordered in ED Medications  artificial tears (LACRILUBE) ophthalmic ointment 1 application (has no administration in time range)  sodium chloride 0.9 % bolus 500 mL (500 mLs Intravenous New Bag/Given 02/23/21 1715)  metoCLOPramide (REGLAN) injection 10 mg (10 mg Intravenous Given 02/23/21 1730)  diphenhydrAMINE (BENADRYL) injection 12.5 mg (12.5 mg Intravenous Given 02/23/21 1728)  valACYclovir (VALTREX) tablet 1,000 mg (1,000 mg Oral Given 02/23/21 1811)    ED Course  I have reviewed the triage vital signs and the nursing notes.  Pertinent labs & imaging results that were available during my care of the patient were reviewed by me and considered in my medical decision making (see chart for details).    MDM Rules/Calculators/A&P                         MERRIC YOST is a 87 y.o. male here presenting with appears to be Bell's palsy.  Patient was thought to  have temporal arteritis several days ago is already on steroids.  His ESR and CRP are normal.  Given his advanced age we will get a CT head to rule out mass.  We will also get labs  7:44 PM Patient's labs and CT head unremarkable.  Given Valtrex for Bell's palsy.  We will have him follow-up  with neurology outpatient   Final Clinical Impression(s) / ED Diagnoses Final diagnoses:  None    Rx / DC Orders ED Discharge Orders    None       Drenda Freeze, MD 02/23/21 1945

## 2021-02-23 NOTE — ED Triage Notes (Signed)
Patient c/o headache x several months was seen by [facility] on Tues. Was started on prednisone since taking the prednisone c/o eyes burning, currenlty c/o headache and right eye pain.

## 2021-02-26 ENCOUNTER — Telehealth: Payer: Self-pay | Admitting: Neurology

## 2021-02-26 NOTE — Telephone Encounter (Signed)
Called daughter [name]. Pt scheduled for Tuesday 02/27/21 at 7:30 AM, arrival 7:15 AM.

## 2021-02-26 NOTE — Telephone Encounter (Signed)
Great - thanks

## 2021-02-26 NOTE — Telephone Encounter (Signed)
Pt scheduled for Tuesday 02/27/21 at 7:30 AM. Pt seen sooner per Dr Lucia Gaskins.

## 2021-02-26 NOTE — Telephone Encounter (Signed)
Pt's daughter [name] on [place] called stating that the pt was seen in the ER on Friday the 13th and was told to inform his Neurologist of the Bell's Palsy dx that he was given. Daughter also states that she is needing to speak to the RN regarding the f/u appt that the pt was to be scheduled for. Please advise.

## 2021-02-27 ENCOUNTER — Encounter: Payer: Self-pay | Admitting: Neurology

## 2021-02-27 ENCOUNTER — Ambulatory Visit: Payer: Medicare Other | Admitting: Neurology

## 2021-02-27 VITALS — BP 118/72 | HR 95 | Ht 68.0 in | Wt 125.0 lb

## 2021-02-27 DIAGNOSIS — G529 Cranial nerve disorder, unspecified: Secondary | ICD-10-CM | POA: Diagnosis not present

## 2021-02-27 DIAGNOSIS — R221 Localized swelling, mass and lump, neck: Secondary | ICD-10-CM

## 2021-02-27 DIAGNOSIS — H02401 Unspecified ptosis of right eyelid: Secondary | ICD-10-CM | POA: Diagnosis not present

## 2021-02-27 DIAGNOSIS — H5711 Ocular pain, right eye: Secondary | ICD-10-CM

## 2021-02-27 DIAGNOSIS — H5789 Other specified disorders of eye and adnexa: Secondary | ICD-10-CM

## 2021-02-27 DIAGNOSIS — G510 Bell's palsy: Secondary | ICD-10-CM

## 2021-02-27 DIAGNOSIS — H00033 Abscess of eyelid right eye, unspecified eyelid: Secondary | ICD-10-CM

## 2021-02-27 DIAGNOSIS — R29810 Facial weakness: Secondary | ICD-10-CM | POA: Diagnosis not present

## 2021-02-27 DIAGNOSIS — R519 Headache, unspecified: Secondary | ICD-10-CM

## 2021-02-27 MED ORDER — PREDNISONE 20 MG PO TABS: 60.0000 mg | ORAL_TABLET | Freq: Every day | ORAL | 0 refills | Status: DC

## 2021-02-27 NOTE — Progress Notes (Addendum)
[facility]    Provider:  Dr Jaynee Eagles Requesting Provider: Prince Solian, MD Primary Care Provider:  Prince Solian, MD  CC:  Right-sided headaches  Interval history 02/27/2021: His neck hurts on the right side where his headaches are. Now he cranial nerve 7 palsy. ESR/CRP normal, steroids helped the headache, but he is still having headaches on the right behind the right eye, he was given antiviral but not steroids for the bells palsy will order, the neck is hurting terribly today, on the right. He woke up Friday and the right side of the face was drooping, still is, right forehead is weak, can't close the eye, eye red.  HPI:  Brent Brennan is a 87 y.o. male here as requested by Prince Solian, MD for daily headaches. PMHx hypertension, Gill Bears syndrome, GERD, diarrhea, dementia, arthritis.  I reviewed Dr. Danna Hefty notes: Patient's been having headaches for several months, he had his ears cleaned out and hearing aids checked at [facility], still having trouble hearing and having headaches every day, says in the middle of the head, coughs a little not a lot, blowing nose little, nonproductive cough, feels sinus pressure between the eyes and forehead rubs it says head hurts sometimes ears hurt to and feels pressure in the ear, they did get quite a bit of wax out, no fevers, shortness of breath, wheezing, chest tightness, takes Excedrin Migraine occasionally, helps only a little.  They treated him with Levaquin for resistant sinusitis for 7 days.  Looks like he is on prednisone daily.  Localized to the forehead with radiation to the cheeks, teeth, ears and neck, no big headache history.  He has had headaches in the past. He had a sinus infection in February and was treated and got better with the infection and the headache, this happened again. They saw the ENT and a tube was placed last Monday in his ear. He has his esophagus stretched. He had some jaw pin on the right. Starts  on the right side of the head. It got better with the antibiotics. But worsening. It is on the right orbital and periorbital area. Kind of sharp at times, comes on after lunch or in the morning and lasts all day. Every day. He states no jaw pain today but daughter says he has had jaw pain. Radiates to the ear and also he has pain in the nack mostlyont he right side. No vision changes, no light or sound sensitivity, no nausea. Tylenol helps a little nothing triggers it, started with the sinus infection. He says it is stable. Moderate in pain. No weakness, numbness or tingling, or focal weakness. He still has pain inside is ear but no congestion.    Reviewed notes, labs and imaging from outside physicians, which showed:   MRI brain 11/14/2020:  IMPRESSION: No intracranial mass, hemorrhage, or abnormal enhancement.  Chronic microvascular ischemic changes.  Small chronic infarcts.  Review of Systems: Patient complains of symptoms per HPI as well as the following symptoms: headache, ear pain. Pertinent negatives and positives per HPI. All others negative.   Social History   Socioeconomic History  . Marital status: Widowed    Spouse name: Not on file  . Number of children: 2  . Years of education: Not on file  . Highest education level: High school graduate  Occupational History  . Not on file  Tobacco Use  . Smoking status: Former Smoker    Packs/day: 2.00    Years: 25.00    Pack  years: 50.00    Types: Cigarettes    Quit date: 10/15/1971    Years since quitting: 49.4  . Smokeless tobacco: Never Used  Vaping Use  . Vaping Use: Never used  Substance and Sexual Activity  . Alcohol use: Not Currently  . Drug use: Never  . Sexual activity: Not on file  Other Topics Concern  . Not on file  Social History Narrative   Daughter lives with patient   Right handed   Caffeine: 1 cup/day   Social Determinants of Health   Financial Resource Strain: Not on file  Food Insecurity: Not on file   Transportation Needs: Not on file  Physical Activity: Not on file  Stress: Not on file  Social Connections: Not on file  Intimate Partner Violence: Not on file    Family History  Problem Relation Age of Onset  . Hypertension Mother   . Diverticulitis Mother   . Other Mother        DJD  . Pneumonia Father   . Alzheimer's disease Father   . Cataracts Father   . Colon cancer Neg Hx   . Esophageal cancer Neg Hx   . Pancreatic cancer Neg Hx   . Stomach cancer Neg Hx   . Heart disease Neg Hx   . Diabetes Neg Hx   . Inflammatory bowel disease Neg Hx   . Liver disease Neg Hx   . Rectal cancer Neg Hx   . Migraines Neg Hx   . Headache Neg Hx     Past Medical History:  Diagnosis Date  . Accidental fall from ladder    resulting in multiple L rib fracturs, traumatic small left hemopneumothorax, left transverse process fracture at T4, T5, and T8 with four-day hospitalization coupled K. by mild from cytopenia and mild hyperglycemia  . Arthritis   . Colon polyps   . Dementia ([place])   . Diarrhea    IBS, Dr Earlean Shawl- suspect malabsorption/maldigestion due to lactose intolerance give the amount of milk products consumed  . Diverticulitis   . GERD (gastroesophageal reflux disease)   . Gilbert's syndrome   . Hiatal hernia   . History of rib fracture 2015   fell from ladder  . Hypertension   . Left inguinal hernia 11/09/2018    Patient Active Problem List   Diagnosis Date Noted  . Right temporal headache 02/20/2021  . Dysphagia 01/02/2021  . Gastroesophageal reflux disease 01/02/2021  . History of colonic polyps 01/02/2021  . Left inguinal hernia 11/09/2018    Past Surgical History:  Procedure Laterality Date  . CATARACT EXTRACTION, BILATERAL    . CHOLECYSTECTOMY    . COLONOSCOPY    . esophagus stretch  02/13/2021  . EYE SURGERY    . HERNIA REPAIR     Right inguinal  . INGUINAL HERNIA REPAIR Left 11/09/2018   Procedure: OPEN REPAIR LEFT INGUINAL HERNIA WITH MESH ERAS  PATHWAY;  Surgeon: Fanny Skates, MD;  Location: [place];  Service: General;  Laterality: Left;  TAP BLOCK  . UPPER GASTROINTESTINAL ENDOSCOPY    . UPPER GASTROINTESTINAL ENDOSCOPY  02/13/2021    Current Outpatient Medications  Medication Sig Dispense Refill  . artificial tears (LACRILUBE) OINT ophthalmic ointment Place into the right eye every 4 (four) hours as needed for dry eyes. 7 g 0  . Ascorbic Acid (VITAMIN C) 1000 MG tablet Take 1,000 mg by mouth daily.    Marland Kitchen aspirin EC 81 MG tablet Take 81 mg by mouth daily.    Marland Kitchen  calcium carbonate (OSCAL) 1500 (600 Ca) MG TABS tablet Take 600 mg of elemental calcium by mouth daily with breakfast.    . Cholecalciferol (VITAMIN D3) 50 MCG (2000 UT) capsule Take 2,000 Units by mouth daily.    Marland Kitchen escitalopram (LEXAPRO) 10 MG tablet Take 10 mg by mouth daily.    . hydrochlorothiazide (HYDRODIURIL) 25 MG tablet Take 25 mg by mouth daily.    . memantine (NAMENDA) 10 MG tablet Take 10 mg by mouth 2 (two) times daily.    . mirtazapine (REMERON) 15 MG tablet Take 15 mg by mouth at bedtime as needed.    . Multiple Vitamin (MULTIVITAMIN WITH MINERALS) TABS tablet Take 1 tablet by mouth daily.    . Multiple Vitamins-Minerals (PRESERVISION AREDS 2 PO) Take 1 tablet by mouth 2 (two) times daily.    Marland Kitchen omeprazole (PRILOSEC) 20 MG capsule Take 1 capsule (20 mg total) by mouth 2 (two) times daily before a meal. 90 capsule 3  . Potassium 99 MG TABS Take 99 mg by mouth daily.    . traMADol (ULTRAM) 50 MG tablet Take 1 tablet (50 mg total) by mouth every 6 (six) hours as needed for severe pain (Only if needed). 15 tablet 0  . valACYclovir (VALTREX) 1000 MG tablet Take 1 tablet (1,000 mg total) by mouth 3 (three) times daily for 7 days. 21 tablet 0  . predniSONE (DELTASONE) 20 MG tablet Take 3 tablets (60 mg total) by mouth daily. Take first dose as soon as possible. Take the other doses in the morning. Always take with food. 15 tablet 0   No current facility-administered  medications for this visit.    Allergies as of 02/27/2021 - Review Complete 02/27/2021  Allergen Reaction Noted  . Aricept [donepezil]  02/20/2021  . Statins  02/20/2021    Vitals: BP 118/72 (BP Location: Left Arm, Patient Position: Sitting)   Pulse 95   Ht $R'5\' 8"'Nn$  (1.727 m)   Wt 125 lb (56.7 kg)   SpO2 97%   BMI 19.01 kg/m  Last Weight:  Wt Readings from Last 1 Encounters:  02/27/21 125 lb (56.7 kg)   Last Height:   Ht Readings from Last 1 Encounters:  02/27/21 $RemoveB'5\' 8"'fMrWZcqT$  (1.727 m)     Physical exam: Exam: Gen: NAD, conversant, well nourised, well groomed                     CV: RRR, no MRG. No Carotid Bruits. No peripheral edema, warm, nontender Eyes: Conjunctivae clear without exudates or hemorrhage  Neuro: Detailed Neurologic Exam  Speech:    Speech is normal; fluent and spontaneous with normal comprehension.  Cognition:    The patient is oriented to person, place, and time;     recent and remote memory intact;     language fluent;     normal attention, concentration,     fund of knowledge Cranial Nerves:    The pupils are equal, round, and reactive to light. The fundi are flat. Visual fields are full to finger confrontation. Extraocular movements are intact. Trigeminal sensation is intact and the muscles of mastication are normal.  Right facial droop, inability to close the right eye, weakness of forehead, the palate elevates in the midline. Hearing intact. Voice is normal. Shoulder shrug is normal. The tongue has normal motion without fasciculations.   Coordination:    Normal finger to nose   Gait:    Good stride and stance  Motor Observation:    No  asymmetry, no atrophy, and no involuntary movements noted. Tone:    Normal muscle tone.    Posture:    Posture is normal. normal erect    Strength:    Strength is V/V in the upper and lower limbs.      Sensation: intact to LT     Reflex Exam:  DTR's:    Deep tendon reflexes in the upper and lower  extremities are brisk bilaterally.   Toes:    The toes are downgoing bilaterally.   Clonus:    Clonus is absent.    Assessment/Plan:   87 y.o. male here as requested by Prince Solian, MD for daily headaches. PMHx hypertension,  GERD, diarrhea, dementia, arthritis. Patient has been having right sided orbital/periorbital pain radiating from is right forehead to the right ear and down the neck for several months, daily, intractable, taking tylenol every day.  Temporal arteritis less likely due to completely normal esr/crp.   This is a patient who came to me with a new right temporal headache radiating into the neck.  He had an MRI of the brain back in February but nothing is improved.  Today he comes in with a peripheral 7th nerve palsy.  I am really worried that we are missing something, due to his neck pain we really need to ensure that there is no head and neck cancer which can also cause peripheral nerve injury by perineural tumor spread, his eye is red, he still has neck pain, still getting right temporal headaches, we need an repeat MRI of the brain and the orbits and he also needs an MRI soft tissue of the neck and he also needs follow-up with his eye doctor.  We are calling [facility] now and they are fitting him in. He was in the emergency room this past weekend.   Since the right eye pain started they have not had an eye exam, need to make sure the cause of his headache and eye pain is not due to something primarily involving the eye.   He is on valtrex for the bells palsy. Will also give him prednisone $RemoveBefore'60mg'tvVWAXCVxGzsB$  x 5 days.   Orders Placed This Encounter  Procedures  . MR NECK SOFT TISSUE W CONTRAST  . MR BRAIN W WO CONTRAST  . MR ORBITS W WO CONTRAST   Meds ordered this encounter  Medications  . predniSONE (DELTASONE) 20 MG tablet    Sig: Take 3 tablets (60 mg total) by mouth daily. Take first dose as soon as possible. Take the other doses in the morning. Always take with food.    Dispense:   15 tablet    Refill:  0    Cc: Prince Solian, MD,  Prince Solian, MD  Sarina Ill, MD  [facility] [address] [place], [place] [address]  I spent over 40 minutes of face-to-face and non-face-to-face time with patient on the  1. Cranial nerve palsy   2. Bell's palsy   3. Ptosis of right eyelid   4. Facial droop   5. Eye redness   6. Pain of right eye   7. Neck mass   8. Right sided temporal headache   9. Eyelid cellulitis, right    diagnosis.  This included previsit chart review, lab review, study review, order entry, electronic health record documentation, patient education on the different diagnostic and therapeutic options, counseling and coordination of care, risks and benefits of management, compliance, or risk factor reduction  Phone [phone] Fax  [phone]

## 2021-02-27 NOTE — Patient Instructions (Signed)
Repeat MRIs Prednisone tablets What is this medicine? PREDNISONE (PRED ni sone) is a corticosteroid. It is commonly used to treat inflammation of the skin, joints, lungs, and other organs. Common conditions treated include asthma, allergies, and arthritis. It is also used for other conditions, such as blood disorders and diseases of the adrenal glands. This medicine may be used for other purposes; ask your health care provider or pharmacist if you have questions. COMMON BRAND NAME(S): Deltasone, Predone, Sterapred, Sterapred DS What should I tell my health care provider before I take this medicine? They need to know if you have any of these conditions:  Cushing's syndrome  diabetes  glaucoma  heart disease  high blood pressure  infection (especially a virus infection such as chickenpox, cold sores, or herpes)  kidney disease  liver disease  mental illness  myasthenia gravis  osteoporosis  seizures  stomach or intestine problems  thyroid disease  an unusual or allergic reaction to lactose, prednisone, other medicines, foods, dyes, or preservatives  pregnant or trying to get pregnant  breast-feeding How should I use this medicine? Take this medicine by mouth with a glass of water. Follow the directions on the prescription label. Take this medicine with food. If you are taking this medicine once a day, take it in the morning. Do not take more medicine than you are told to take. Do not suddenly stop taking your medicine because you may develop a severe reaction. Your doctor will tell you how much medicine to take. If your doctor wants you to stop the medicine, the dose may be slowly lowered over time to avoid any side effects. Talk to your pediatrician regarding the use of this medicine in children. Special care may be needed. Overdosage: If you think you have taken too much of this medicine contact a poison control center or emergency room at once. NOTE: This medicine is  only for you. Do not share this medicine with others. What if I miss a dose? If you miss a dose, take it as soon as you can. If it is almost time for your next dose, talk to your doctor or health care professional. You may need to miss a dose or take an extra dose. Do not take double or extra doses without advice. What may interact with this medicine? Do not take this medicine with any of the following medications:  metyrapone  mifepristone This medicine may also interact with the following medications:  aminoglutethimide  amphotericin B  aspirin and aspirin-like medicines  barbiturates  certain medicines for diabetes, like glipizide or glyburide  cholestyramine  cholinesterase inhibitors  cyclosporine  digoxin  diuretics  ephedrine  male hormones, like estrogens and birth control pills  isoniazid  ketoconazole  NSAIDS, medicines for pain and inflammation, like ibuprofen or naproxen  phenytoin  rifampin  toxoids  vaccines  warfarin This list may not describe all possible interactions. Give your health care provider a list of all the medicines, herbs, non-prescription drugs, or dietary supplements you use. Also tell them if you smoke, drink alcohol, or use illegal drugs. Some items may interact with your medicine. What should I watch for while using this medicine? Visit your doctor or health care professional for regular checks on your progress. If you are taking this medicine over a prolonged period, carry an identification card with your name and address, the type and dose of your medicine, and your doctor's name and address. This medicine may increase your risk of getting an infection. Tell your  doctor or health care professional if you are around anyone with measles or chickenpox, or if you develop sores or blisters that do not heal properly. If you are going to have surgery, tell your doctor or health care professional that you have taken this medicine  within the last twelve months. Ask your doctor or health care professional about your diet. You may need to lower the amount of salt you eat. This medicine may increase blood sugar. Ask your healthcare provider if changes in diet or medicines are needed if you have diabetes. What side effects may I notice from receiving this medicine? Side effects that you should report to your doctor or health care professional as soon as possible:  allergic reactions like skin rash, itching or hives, swelling of the face, lips, or tongue  changes in emotions or moods  changes in vision  depressed mood  eye pain  fever or chills, cough, sore throat, pain or difficulty passing urine  signs and symptoms of high blood sugar such as being more thirsty or hungry or having to urinate more than normal. You may also feel very tired or have blurry vision.  swelling of ankles, feet Side effects that usually do not require medical attention (report to your doctor or health care professional if they continue or are bothersome):  confusion, excitement, restlessness  headache  nausea, vomiting  skin problems, acne, thin and shiny skin  trouble sleeping  weight gain This list may not describe all possible side effects. Call your doctor for medical advice about side effects. You may report side effects to FDA at 1-800-FDA-1088. Where should I keep my medicine? Keep out of the reach of children. Store at room temperature between 15 and 30 degrees C (59 and 86 degrees F). Protect from light. Keep container tightly closed. Throw away any unused medicine after the expiration date. NOTE: This sheet is a summary. It may not cover all possible information. If you have questions about this medicine, talk to your doctor, pharmacist, or health care provider.  2021 Elsevier/Gold Standard (2018-06-30 10:54:22)

## 2021-02-28 ENCOUNTER — Telehealth: Payer: Self-pay | Admitting: Neurology

## 2021-02-28 NOTE — Telephone Encounter (Signed)
MRIs (3) sent to [facility]. No auth required for [facility] Medicare.

## 2021-03-11 ENCOUNTER — Other Ambulatory Visit: Payer: Self-pay

## 2021-03-11 ENCOUNTER — Ambulatory Visit
Admission: RE | Admit: 2021-03-11 | Discharge: 2021-03-11 | Disposition: A | Discharge status: Home / Self Care | Payer: Medicare Other | Source: Ambulatory Visit | Attending: Neurology | Admitting: Neurology

## 2021-03-11 DIAGNOSIS — H00033 Abscess of eyelid right eye, unspecified eyelid: Secondary | ICD-10-CM

## 2021-03-11 DIAGNOSIS — R221 Localized swelling, mass and lump, neck: Secondary | ICD-10-CM

## 2021-03-11 DIAGNOSIS — R29810 Facial weakness: Secondary | ICD-10-CM

## 2021-03-11 DIAGNOSIS — H02401 Unspecified ptosis of right eyelid: Secondary | ICD-10-CM

## 2021-03-11 DIAGNOSIS — G529 Cranial nerve disorder, unspecified: Secondary | ICD-10-CM

## 2021-03-11 DIAGNOSIS — H5789 Other specified disorders of eye and adnexa: Secondary | ICD-10-CM

## 2021-03-11 DIAGNOSIS — R519 Headache, unspecified: Secondary | ICD-10-CM

## 2021-03-11 DIAGNOSIS — H5711 Ocular pain, right eye: Secondary | ICD-10-CM

## 2021-03-11 MED ORDER — GADOBENATE DIMEGLUMINE 529 MG/ML IV SOLN
12.0000 mL | Freq: Once | INTRAVENOUS | Status: AC | PRN
  Administered 2021-03-11: 12 mL via INTRAVENOUS

## 2021-03-13 ENCOUNTER — Other Ambulatory Visit: Payer: Self-pay | Admitting: Neurology

## 2021-03-13 ENCOUNTER — Ambulatory Visit
Admission: RE | Admit: 2021-03-13 | Discharge: 2021-03-13 | Disposition: A | Discharge status: Home / Self Care | Payer: Medicare Other | Source: Ambulatory Visit | Attending: Neurology | Admitting: Neurology

## 2021-03-13 DIAGNOSIS — R519 Headache, unspecified: Secondary | ICD-10-CM

## 2021-03-13 DIAGNOSIS — H00033 Abscess of eyelid right eye, unspecified eyelid: Secondary | ICD-10-CM

## 2021-03-13 DIAGNOSIS — G529 Cranial nerve disorder, unspecified: Secondary | ICD-10-CM

## 2021-03-13 DIAGNOSIS — R221 Localized swelling, mass and lump, neck: Secondary | ICD-10-CM

## 2021-03-13 DIAGNOSIS — H5711 Ocular pain, right eye: Secondary | ICD-10-CM

## 2021-03-13 DIAGNOSIS — H02401 Unspecified ptosis of right eyelid: Secondary | ICD-10-CM

## 2021-03-13 DIAGNOSIS — H5789 Other specified disorders of eye and adnexa: Secondary | ICD-10-CM

## 2021-03-13 DIAGNOSIS — R29810 Facial weakness: Secondary | ICD-10-CM

## 2021-03-13 MED ORDER — GADOBENATE DIMEGLUMINE 529 MG/ML IV SOLN
12.0000 mL | Freq: Once | INTRAVENOUS | Status: AC | PRN
  Administered 2021-03-13: 12 mL via INTRAVENOUS

## 2021-03-14 ENCOUNTER — Other Ambulatory Visit: Payer: Self-pay | Admitting: Neurology

## 2021-03-14 ENCOUNTER — Telehealth: Payer: Self-pay | Admitting: *Deleted

## 2021-03-14 DIAGNOSIS — J392 Other diseases of pharynx: Secondary | ICD-10-CM

## 2021-03-14 DIAGNOSIS — H7491 Unspecified disorder of right middle ear and mastoid: Secondary | ICD-10-CM

## 2021-03-14 NOTE — Telephone Encounter (Signed)
-----   Message from Anson Fret, MD sent at 03/13/2021  9:54 AM EDT ----- Toma Copier: Patient has extensive fluid behind his right ear, in the mastoid("extensive mastoid effusion"). Radiology has recommended we follow up with a CT of the neck to further visualize for any causes and possibly we can send him back to the ENT to see if they have anything else to offer(CC Dr. Felipa Eth). If patient is ok with it let me know and I can order the CT thanks. Per radiology consider "Consider neck CT with contrast to better exclude a posterior nasopharyngeal mass on the right."

## 2021-03-14 NOTE — Telephone Encounter (Signed)
Spoke with patient's daughter, [name] (on [place]) discussed MRI neck results.  She understands there was fluid found behind the right ear.  She stated the patient has yellow fluid that leaks out and that is the same ear that he had the tube put in.  She is amenable to proceeding with a CT of the neck to further visualize and rule out possible mass.  She is aware we will place the order and then she will receive a call from GI to schedule.  She stated the patient has hardly complained of any headache or neck aches and wonders if that is from the release of fluid.  She verbalized appreciation for the call.

## 2021-03-15 ENCOUNTER — Telehealth: Payer: Self-pay | Admitting: Neurology

## 2021-03-15 NOTE — Telephone Encounter (Signed)
I spoke with daughter [name], also left a message with Dr. Lucky Rathke office(ENT) and sent Dr. 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 thanks  Soft tissues: Limited images of the skull base and nasopharynx show extensive ill-defined nasopharyngeal submucosal mass lesion, asymmetric to the right, involving the right prevertebral muscles, parapharyngeal spaces and clivus (series 11, image 18). Correlation with prior MRI shows possible involvement of the right temporomandibular joint and deep lobe the right parotid. The lesion involves the upper cervical segment of the right ICA which remains patent. The right jugular vein appear collapsed. Prominent right mastoid effusion related to involvement of the right eustachian tube.  Limited intracranial: Scattered foci of T2 hyperintensity within the white matter of the cerebral hemispheres, nonspecific, most likely related to chronic small vessel ischemia. Moderate parenchymal loss.  IMPRESSION: 1. Unremarkable appearance of the orbits bilaterally. 2. 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

## 2021-03-19 ENCOUNTER — Telehealth: Payer: Self-pay | Admitting: Neurology

## 2021-03-19 NOTE — Telephone Encounter (Signed)
CT soft tissue neck with contrast [facility] NPR case #[id number] sent to GI

## 2021-03-22 NOTE — Telephone Encounter (Signed)
Daughter had sent a mychart message yesterday with another question. Will combine responses into other message.

## 2021-07-08 ENCOUNTER — Other Ambulatory Visit: Payer: Self-pay | Admitting: Gastroenterology

## 2022-04-22 ENCOUNTER — Ambulatory Visit: Payer: Medicare Other | Admitting: Plastic Surgery

## 2022-04-22 VITALS — BP 133/76 | HR 66 | Ht 68.0 in | Wt 135.8 lb

## 2022-04-22 DIAGNOSIS — G510 Bell's palsy: Secondary | ICD-10-CM | POA: Diagnosis not present

## 2022-04-22 NOTE — Progress Notes (Signed)
Referring Provider Chilton Greathouse, MD [address] [place],  [place] [address]   CC:  Right face Bell's palsy  Brent Brennan is an 88 y.o. male.  HPI: Patient is an 88 year old who is otherwise very healthy but he has 1 year onset of Bell's palsy.  The patient is interesting in possible surgery to improve his facial drooping on the right.  He has a non-smoker.    Allergies  Allergen Reactions   Aricept [Donepezil]     Loose stools and insomnia   Statins     Increased LFT    Outpatient Encounter Medications as of 04/22/2022  Medication Sig   artificial tears (LACRILUBE) OINT ophthalmic ointment Place into the right eye every 4 (four) hours as needed for dry eyes.   Ascorbic Acid (VITAMIN C) 1000 MG tablet Take 1,000 mg by mouth daily.   aspirin EC 81 MG tablet Take 81 mg by mouth daily.   calcium carbonate (OSCAL) 1500 (600 Ca) MG TABS tablet Take 600 mg of elemental calcium by mouth daily with breakfast.   Cholecalciferol (VITAMIN D3) 50 MCG (2000 UT) capsule Take 2,000 Units by mouth daily.   escitalopram (LEXAPRO) 10 MG tablet Take 10 mg by mouth daily.   hydrochlorothiazide (HYDRODIURIL) 25 MG tablet Take 25 mg by mouth daily.   memantine (NAMENDA) 10 MG tablet Take 10 mg by mouth 2 (two) times daily.   mirtazapine (REMERON) 15 MG tablet Take 15 mg by mouth at bedtime as needed.   Multiple Vitamins-Minerals (PRESERVISION AREDS 2 PO) Take 1 tablet by mouth 2 (two) times daily.   omeprazole (PRILOSEC) 20 MG capsule TAKE 1 CAPSULE(20 MG) BY MOUTH TWICE DAILY BEFORE A MEAL   omeprazole (PRILOSEC) 20 MG capsule TAKE 1 CAPSULE(20 MG) BY MOUTH TWICE DAILY BEFORE A MEAL   Potassium 99 MG TABS Take 99 mg by mouth daily.   traMADol (ULTRAM) 50 MG tablet Take 1 tablet (50 mg total) by mouth every 6 (six) hours as needed for severe pain (Only if needed).   [DISCONTINUED] Multiple Vitamin (MULTIVITAMIN WITH MINERALS) TABS tablet Take 1 tablet by mouth daily.   [DISCONTINUED]  predniSONE (DELTASONE) 20 MG tablet Take 3 tablets (60 mg total) by mouth daily. Take first dose as soon as possible. Take the other doses in the morning. Always take with food.   No facility-administered encounter medications on file as of 04/22/2022.     Past Medical History:  Diagnosis Date   Accidental fall from ladder    resulting in multiple L rib fracturs, traumatic small left hemopneumothorax, left transverse process fracture at T4, T5, and T8 with four-day hospitalization coupled K. by mild from cytopenia and mild hyperglycemia   Arthritis    Colon polyps    Dementia (HCC)    Diarrhea    IBS, Dr Kinnie Scales- suspect malabsorption/maldigestion due to lactose intolerance give the amount of milk products consumed   Diverticulitis    GERD (gastroesophageal reflux disease)    Gilbert's syndrome    Hiatal hernia    History of rib fracture 2015   fell from ladder   Hypertension    Left inguinal hernia 11/09/2018    Past Surgical History:  Procedure Laterality Date   CATARACT EXTRACTION, BILATERAL     CHOLECYSTECTOMY     COLONOSCOPY     esophagus stretch  02/13/2021   EYE SURGERY     HERNIA REPAIR     Right inguinal   INGUINAL HERNIA REPAIR Left 11/09/2018   Procedure:  OPEN REPAIR LEFT INGUINAL HERNIA WITH MESH ERAS PATHWAY;  Surgeon: Claud Kelp, MD;  Location: [facility] OR;  Service: General;  Laterality: Left;  TAP BLOCK   UPPER GASTROINTESTINAL ENDOSCOPY     UPPER GASTROINTESTINAL ENDOSCOPY  02/13/2021    Family History  Problem Relation Age of Onset   Hypertension Mother    Diverticulitis Mother    Other Mother        DJD   Pneumonia Father    Alzheimer's disease Father    Cataracts Father    Colon cancer Neg Hx    Esophageal cancer Neg Hx    Pancreatic cancer Neg Hx    Stomach cancer Neg Hx    Heart disease Neg Hx    Diabetes Neg Hx    Inflammatory bowel disease Neg Hx    Liver disease Neg Hx    Rectal cancer Neg Hx    Migraines Neg Hx    Headache Neg Hx      Social History   Social History Narrative   Daughter lives with patient   Right handed   Caffeine: 1 cup/day     Review of Systems General: Denies fevers, chills, weight loss CV: Denies chest pain, shortness of breath, palpitations   Physical Exam    04/22/2022   10:15 AM 02/27/2021    7:31 AM 02/23/2021    8:10 PM  Vitals with BMI  Height 5\' 8"  5\' 8"    Weight 135 lbs 13 oz 125 lbs   BMI 20.65 19.01   Systolic 133 118  Diastolic 76 72 72  Pulse 66 95 81    General:  No acute distress,  Alert and oriented, Non-Toxic, Normal speech and affect Heent: Significant right facial droop  Assessment/Plan Patient may be a candidate for right facial local tissue rearrangement to help resuspend the soft tissues of his face.  I explained to him I do not expect a complete correction but we could certainly improve his facial symmetry and its possible this may also help the drooling that he complains about.    04/22/2022, 10:33 AM

## 2022-06-24 ENCOUNTER — Encounter: Payer: Self-pay | Admitting: Physician Assistant

## 2022-06-24 ENCOUNTER — Ambulatory Visit (INDEPENDENT_AMBULATORY_CARE_PROVIDER_SITE_OTHER): Payer: Medicare Other | Admitting: Physician Assistant

## 2022-06-24 VITALS — BP 121/74 | HR 63 | Ht 70.5 in | Wt 133.6 lb

## 2022-06-24 DIAGNOSIS — G510 Bell's palsy: Secondary | ICD-10-CM

## 2022-06-24 NOTE — H&P (View-Only) (Signed)
   Patient ID: Brent Brennan, male    DOB: 10/17/1930, 88 y.o.   MRN: [id number]  Chief Complaint  Patient presents with   Pre-op Exam    No diagnosis found.   History of Present Illness: Brent Brennan is a 88 y.o.  male  with a history of Bell's palsy.  He presents for preoperative evaluation for upcoming procedure, adjacent tissue transfer/tissue arrangement, scheduled for 07/08/2022 with Dr. Luppens.  The patient has not had problems with anesthesia.   Summary of Previous Visit: The patient presented to our office on 04/22/2022 and was seen by Dr. Luppens.  At that time he noted a 1 year history of Bell's palsy with complete right-sided paralysis.  The patient is currently on aspirin, he is uncertain why he is on this but his daughter is present and notes that he has never had heart attack stroke or any sort of vascular procedures.  She notes he has been on this for a extended period of time.  Job: Retired  [employer] Significant for: Bell's palsy, hypertension   Past Medical History: Allergies: No Active Allergies  Current Medications:  Current Outpatient Medications:    artificial tears (LACRILUBE) OINT ophthalmic ointment, Place into the right eye every 4 (four) hours as needed for dry eyes., Disp: 7 g, Rfl: 0   Ascorbic Acid (VITAMIN C) 1000 MG tablet, Take 1,000 mg by mouth daily., Disp: , Rfl:    aspirin EC 81 MG tablet, Take 81 mg by mouth daily., Disp: , Rfl:    calcium carbonate (OSCAL) 1500 (600 Ca) MG TABS tablet, Take 600 mg of elemental calcium by mouth daily with breakfast., Disp: , Rfl:    Cholecalciferol (VITAMIN D3) 50 MCG (2000 UT) capsule, Take 2,000 Units by mouth daily., Disp: , Rfl:    escitalopram (LEXAPRO) 10 MG tablet, Take 10 mg by mouth daily., Disp: , Rfl:    hydrochlorothiazide (HYDRODIURIL) 25 MG tablet, Take 25 mg by mouth daily., Disp: , Rfl:    memantine (NAMENDA) 10 MG tablet, Take 10 mg by mouth 2 (two) times daily., Disp: , Rfl:     mirtazapine (REMERON) 15 MG tablet, Take 15 mg by mouth at bedtime as needed., Disp: , Rfl:    Multiple Vitamins-Minerals (PRESERVISION AREDS 2 PO), Take 1 tablet by mouth 2 (two) times daily., Disp: , Rfl:    omeprazole (PRILOSEC) 20 MG capsule, TAKE 1 CAPSULE(20 MG) BY MOUTH TWICE DAILY BEFORE A MEAL, Disp: 90 capsule, Rfl: 3   omeprazole (PRILOSEC) 20 MG capsule, TAKE 1 CAPSULE(20 MG) BY MOUTH TWICE DAILY BEFORE A MEAL, Disp: 90 capsule, Rfl: 3   Potassium 99 MG TABS, Take 99 mg by mouth daily., Disp: , Rfl:    traMADol (ULTRAM) 50 MG tablet, Take 1 tablet (50 mg total) by mouth every 6 (six) hours as needed for severe pain (Only if needed)., Disp: 15 tablet, Rfl: 0  Past Medical Problems: Past Medical History:  Diagnosis Date   Accidental fall from ladder    resulting in multiple L rib fracturs, traumatic small left hemopneumothorax, left transverse process fracture at T4, T5, and T8 with four-day hospitalization coupled K. by mild from cytopenia and mild hyperglycemia   Arthritis    Colon polyps    Dementia (HCC)    Diarrhea    IBS, Dr Medoff- suspect malabsorption/maldigestion due to lactose intolerance give the amount of milk products consumed   Diverticulitis    GERD (gastroesophageal reflux disease)    Gilbert's   syndrome    Hiatal hernia    History of rib fracture 2015   fell from ladder   Hypertension    Left inguinal hernia 11/09/2018    Past Surgical History: Past Surgical History:  Procedure Laterality Date   CATARACT EXTRACTION, BILATERAL     CHOLECYSTECTOMY     COLONOSCOPY     esophagus stretch  02/13/2021   EYE SURGERY     HERNIA REPAIR     Right inguinal   INGUINAL HERNIA REPAIR Left 11/09/2018   Procedure: OPEN REPAIR LEFT INGUINAL HERNIA WITH MESH ERAS PATHWAY;  Surgeon: Claud Kelp, MD;  Location: [facility] OR;  Service: General;  Laterality: Left;  TAP BLOCK   UPPER GASTROINTESTINAL ENDOSCOPY     UPPER GASTROINTESTINAL ENDOSCOPY  02/13/2021    Social  History: Social History   Socioeconomic History   Marital status: Widowed    Spouse name: Not on file   Number of children: 2   Years of education: Not on file   Highest education level: High school graduate  Occupational History   Not on file  Tobacco Use   Smoking status: Former    Packs/day: 2.00    Years: 25.00    Total pack years: 50.00    Types: Cigarettes    Quit date: 10/15/1971    Years since quitting: 50.7   Smokeless tobacco: Never  Vaping Use   Vaping Use: Never used  Substance and Sexual Activity   Alcohol use: Not Currently   Drug use: Never   Sexual activity: Not on file  Other Topics Concern   Not on file  Social History Narrative   Daughter lives with patient   Right handed   Caffeine: 1 cup/day   Social Determinants of Health   Financial Resource Strain: Not on file  Food Insecurity: Not on file  Transportation Needs: Not on file  Physical Activity: Not on file  Stress: Not on file  Social Connections: Not on file  Intimate Partner Violence: Not on file    Family History: Family History  Problem Relation Age of Onset   Hypertension Mother    Diverticulitis Mother    Other Mother        DJD   Pneumonia Father    Alzheimer's disease Father    Cataracts Father    Colon cancer Neg Hx    Esophageal cancer Neg Hx    Pancreatic cancer Neg Hx    Stomach cancer Neg Hx    Heart disease Neg Hx    Diabetes Neg Hx    Inflammatory bowel disease Neg Hx    Liver disease Neg Hx    Rectal cancer Neg Hx    Migraines Neg Hx    Headache Neg Hx     Review of Systems: ROS  Physical Exam: Vital Signs BP 121/74 (BP Location: Left Arm, Patient Position: Sitting, Cuff Size: Normal)   Pulse 63   Ht 5' 10.5" (1.791 m)   Wt 133 lb 9.6 oz (60.6 kg)   SpO2 97%   BMI 18.90 kg/m   Physical Exam  Constitutional:      General: Not in acute distress.    Appearance: Normal appearance. Not ill-appearing.  HENT:     Head: Normocephalic and atraumatic.   Eyes:     Pupils: Pupils are equal, round. Cardiovascular:     Rate and Rhythm: Normal rate. Pulmonary:     Effort: No respiratory distress or increased work of breathing.  Speaks in full sentences. Musculoskeletal:  Normal range of motion. No lower extremity swelling or edema. No varicosities.  Skin:    General: Skin is warm and dry.     Findings: No erythema or rash.  Neurological:     Mental Status: Alert and oriented to person, place, and time.  Psychiatric:        Mood and Affect: Mood normal.        Behavior: Behavior normal.    Assessment/Plan: The patient is scheduled for adjacent tissue transfer/tissue arrangement, with Dr. Domenica Reamer.  Risks, benefits, and alternatives of procedure discussed, questions answered and consent obtained.    Smoking Status: Non-smoker  Caprini Score: 5; Risk Factors include: Age, length of surgery; Recommendation is early ambulation  Pictures obtained: 04/22/2022  Post-op Rx sent to pharmacy: None  Patient was provided with the  General Surgical Risk consent document and Pain Medication Agreement prior to their appointment.  They had adequate time to read through the risk consent documents and Pain Medication Agreement. We also discussed them in person together during this preop appointment. All of their questions were answered to their satisfaction.  Recommended calling if they have any further questions.  Risk consent form and Pain Medication Agreement to be scanned into patient's chart.     Electronically signed by: Kelle Darting Joretta Eads, PA-C 06/24/2022 11:19 AM

## 2022-06-24 NOTE — Progress Notes (Signed)
Patient ID: Brent Brennan, male    DOB: 1930-11-01, 88 y.o.   MRN: [id number]  Chief Complaint  Patient presents with   Pre-op Exam    No diagnosis found.   History of Present Illness: Brent Brennan is a 88 y.o.  male  with a history of Bell's palsy.  He presents for preoperative evaluation for upcoming procedure, adjacent tissue transfer/tissue arrangement, scheduled for 07/08/2022 with Dr. Erin Hearing.  The patient has not had problems with anesthesia.   Summary of Previous Visit: The patient presented to our office on 04/22/2022 and was seen by Dr. Erin Hearing.  At that time he noted a 1 year history of Bell's palsy with complete right-sided paralysis.  The patient is currently on aspirin, he is uncertain why he is on this but his daughter is present and notes that he has never had heart attack stroke or any sort of vascular procedures.  She notes he has been on this for a extended period of time.  Job: Retired  [employer] Significant for: Bell's palsy, hypertension   Past Medical History: Allergies: No Active Allergies  Current Medications:  Current Outpatient Medications:    artificial tears (LACRILUBE) OINT ophthalmic ointment, Place into the right eye every 4 (four) hours as needed for dry eyes., Disp: 7 g, Rfl: 0   Ascorbic Acid (VITAMIN C) 1000 MG tablet, Take 1,000 mg by mouth daily., Disp: , Rfl:    aspirin EC 81 MG tablet, Take 81 mg by mouth daily., Disp: , Rfl:    calcium carbonate (OSCAL) 1500 (600 Ca) MG TABS tablet, Take 600 mg of elemental calcium by mouth daily with breakfast., Disp: , Rfl:    Cholecalciferol (VITAMIN D3) 50 MCG (2000 UT) capsule, Take 2,000 Units by mouth daily., Disp: , Rfl:    escitalopram (LEXAPRO) 10 MG tablet, Take 10 mg by mouth daily., Disp: , Rfl:    hydrochlorothiazide (HYDRODIURIL) 25 MG tablet, Take 25 mg by mouth daily., Disp: , Rfl:    memantine (NAMENDA) 10 MG tablet, Take 10 mg by mouth 2 (two) times daily., Disp: , Rfl:     mirtazapine (REMERON) 15 MG tablet, Take 15 mg by mouth at bedtime as needed., Disp: , Rfl:    Multiple Vitamins-Minerals (PRESERVISION AREDS 2 PO), Take 1 tablet by mouth 2 (two) times daily., Disp: , Rfl:    omeprazole (PRILOSEC) 20 MG capsule, TAKE 1 CAPSULE(20 MG) BY MOUTH TWICE DAILY BEFORE A MEAL, Disp: 90 capsule, Rfl: 3   omeprazole (PRILOSEC) 20 MG capsule, TAKE 1 CAPSULE(20 MG) BY MOUTH TWICE DAILY BEFORE A MEAL, Disp: 90 capsule, Rfl: 3   Potassium 99 MG TABS, Take 99 mg by mouth daily., Disp: , Rfl:    traMADol (ULTRAM) 50 MG tablet, Take 1 tablet (50 mg total) by mouth every 6 (six) hours as needed for severe pain (Only if needed)., Disp: 15 tablet, Rfl: 0  Past Medical Problems: Past Medical History:  Diagnosis Date   Accidental fall from ladder    resulting in multiple L rib fracturs, traumatic small left hemopneumothorax, left transverse process fracture at T4, T5, and T8 with four-day hospitalization coupled K. by mild from cytopenia and mild hyperglycemia   Arthritis    Colon polyps    Dementia (HCC)    Diarrhea    IBS, Dr Earlean Shawl- suspect malabsorption/maldigestion due to lactose intolerance give the amount of milk products consumed   Diverticulitis    GERD (gastroesophageal reflux disease)    Gilbert's  syndrome    Hiatal hernia    History of rib fracture 2015   fell from ladder   Hypertension    Left inguinal hernia 11/09/2018    Past Surgical History: Past Surgical History:  Procedure Laterality Date   CATARACT EXTRACTION, BILATERAL     CHOLECYSTECTOMY     COLONOSCOPY     esophagus stretch  02/13/2021   EYE SURGERY     HERNIA REPAIR     Right inguinal   INGUINAL HERNIA REPAIR Left 11/09/2018   Procedure: OPEN REPAIR LEFT INGUINAL HERNIA WITH MESH ERAS PATHWAY;  Surgeon: Claud Kelp, MD;  Location: [facility] OR;  Service: General;  Laterality: Left;  TAP BLOCK   UPPER GASTROINTESTINAL ENDOSCOPY     UPPER GASTROINTESTINAL ENDOSCOPY  02/13/2021    Social  History: Social History   Socioeconomic History   Marital status: Widowed    Spouse name: Not on file   Number of children: 2   Years of education: Not on file   Highest education level: High school graduate  Occupational History   Not on file  Tobacco Use   Smoking status: Former    Packs/day: 2.00    Years: 25.00    Total pack years: 50.00    Types: Cigarettes    Quit date: 10/15/1971    Years since quitting: 50.7   Smokeless tobacco: Never  Vaping Use   Vaping Use: Never used  Substance and Sexual Activity   Alcohol use: Not Currently   Drug use: Never   Sexual activity: Not on file  Other Topics Concern   Not on file  Social History Narrative   Daughter lives with patient   Right handed   Caffeine: 1 cup/day   Social Determinants of Health   Financial Resource Strain: Not on file  Food Insecurity: Not on file  Transportation Needs: Not on file  Physical Activity: Not on file  Stress: Not on file  Social Connections: Not on file  Intimate Partner Violence: Not on file    Family History: Family History  Problem Relation Age of Onset   Hypertension Mother    Diverticulitis Mother    Other Mother        DJD   Pneumonia Father    Alzheimer's disease Father    Cataracts Father    Colon cancer Neg Hx    Esophageal cancer Neg Hx    Pancreatic cancer Neg Hx    Stomach cancer Neg Hx    Heart disease Neg Hx    Diabetes Neg Hx    Inflammatory bowel disease Neg Hx    Liver disease Neg Hx    Rectal cancer Neg Hx    Migraines Neg Hx    Headache Neg Hx     Review of Systems: ROS  Physical Exam: Vital Signs BP 121/74 (BP Location: Left Arm, Patient Position: Sitting, Cuff Size: Normal)   Pulse 63   Ht 5' 10.5" (1.791 m)   Wt 133 lb 9.6 oz (60.6 kg)   SpO2 97%   BMI 18.90 kg/m   Physical Exam  Constitutional:      General: Not in acute distress.    Appearance: Normal appearance. Not ill-appearing.  HENT:     Head: Normocephalic and atraumatic.   Eyes:     Pupils: Pupils are equal, round. Cardiovascular:     Rate and Rhythm: Normal rate. Pulmonary:     Effort: No respiratory distress or increased work of breathing.  Speaks in full sentences. Musculoskeletal:  Normal range of motion. No lower extremity swelling or edema. No varicosities.  Skin:    General: Skin is warm and dry.     Findings: No erythema or rash.  Neurological:     Mental Status: Alert and oriented to person, place, and time.  Psychiatric:        Mood and Affect: Mood normal.        Behavior: Behavior normal.    Assessment/Plan: The patient is scheduled for adjacent tissue transfer/tissue arrangement, with Dr. Domenica Reamer.  Risks, benefits, and alternatives of procedure discussed, questions answered and consent obtained.    Smoking Status: Non-smoker  Caprini Score: 5; Risk Factors include: Age, length of surgery; Recommendation is early ambulation  Pictures obtained: 04/22/2022  Post-op Rx sent to pharmacy: None  Patient was provided with the  General Surgical Risk consent document and Pain Medication Agreement prior to their appointment.  They had adequate time to read through the risk consent documents and Pain Medication Agreement. We also discussed them in person together during this preop appointment. All of their questions were answered to their satisfaction.  Recommended calling if they have any further questions.  Risk consent form and Pain Medication Agreement to be scanned into patient's chart.     Electronically signed by: Kelle Darting Breya Cass, PA-C 06/24/2022 11:19 AM

## 2022-06-28 NOTE — Pre-Procedure Instructions (Signed)
Surgical Instructions    Your procedure is scheduled on July 08, 2022.  Report to [facility] Main Entrance "A" at 7:30 A.M., then check in with the Admitting office.  Call this number if you have problems the morning of surgery:  [phone]   If you have any questions prior to your surgery date call [phone]: Open Monday-Friday 8am-4pm    Remember:  Do not eat or drink after midnight the night before your surgery      Take these medicines the morning of surgery with A SIP OF WATER:  escitalopram (LEXAPRO)  memantine (NAMENDA)   omeprazole (PRILOSEC)   traMADol (ULTRAM)   Take these medicines the morning of surgery AS NEEDED:  ARTIFICIAL TEAR SOLUTION OP    Follow your surgeon's instructions on when to stop Aspirin.  If no instructions were given by your surgeon then you will need to call the office to get those instructions.     As of today, STOP taking any Aleve, Naproxen, Ibuprofen, Motrin, Advil, Goody's, BC's, all herbal medications, fish oil, and all vitamins.                     Do NOT Smoke (Tobacco/Vaping) for 24 hours prior to your procedure.  If you use a CPAP at night, you may bring your mask/headgear for your overnight stay.   Contacts, glasses, piercing's, hearing aid's, dentures or partials may not be worn into surgery, please bring cases for these belongings.    For patients admitted to the hospital, discharge time will be determined by your treatment team.   Patients discharged the day of surgery will not be allowed to drive home, and someone needs to stay with them for 24 hours.  SURGICAL WAITING ROOM VISITATION Patients having surgery or a procedure may have no more than 2 support people in the waiting area - these visitors may rotate.   Children under the age of 74 must have an adult with them who is not the patient. If the patient needs to stay at the hospital during part of their recovery, the visitor guidelines for inpatient rooms  apply. Pre-op nurse will coordinate an appropriate time for 1 support person to accompany patient in pre-op.  This support person may not rotate.   Please refer to the [facility] website for the visitor guidelines for Inpatients (after your surgery is over and you are in a regular room).    Special instructions:   [place]- Preparing For Surgery  Before surgery, you can play an important role. Because skin is not sterile, your skin needs to be as free of germs as possible. You can reduce the number of germs on your skin by washing with CHG (chlorahexidine gluconate) Soap before surgery.  CHG is an antiseptic cleaner which kills germs and bonds with the skin to continue killing germs even after washing.    Oral Hygiene is also important to reduce your risk of infection.  Remember - BRUSH YOUR TEETH THE MORNING OF SURGERY WITH YOUR REGULAR TOOTHPASTE  Please do not use if you have an allergy to CHG or antibacterial soaps. If your skin becomes reddened/irritated stop using the CHG.  Do not shave (including legs and underarms) for at least 48 hours prior to first CHG shower. It is OK to shave your face.  Please follow these instructions carefully.   Shower the NIGHT BEFORE SURGERY and the MORNING OF SURGERY  If you chose to wash your hair, wash your hair first as  usual with your normal shampoo.  After you shampoo, rinse your hair and body thoroughly to remove the shampoo.  Use CHG Soap as you would any other liquid soap. You can apply CHG directly to the skin and wash gently with a scrungie or a clean washcloth.   Apply the CHG Soap to your body ONLY FROM THE NECK DOWN.  Do not use on open wounds or open sores. Avoid contact with your eyes, ears, mouth and genitals (private parts). Wash Face and genitals (private parts)  with your normal soap.   Wash thoroughly, paying special attention to the area where your surgery will be performed.  Thoroughly rinse your body with warm water from the  neck down.  DO NOT shower/wash with your normal soap after using and rinsing off the CHG Soap.  Pat yourself dry with a CLEAN TOWEL.  Wear CLEAN PAJAMAS to bed the night before surgery  Place CLEAN SHEETS on your bed the night before your surgery  DO NOT SLEEP WITH PETS.   Day of Surgery: Take a shower with CHG soap. Do not wear jewelry or makeup Do not wear lotions, powders, colognes, or deodorant. Do not shave 48 hours prior to surgery.  Men may shave face and neck. Do not bring valuables to the hospital.  [facility] is not responsible for any belongings or valuables. Do not wear nail polish, gel polish, artificial nails, or any other type of covering on natural nails (fingers and toes) If you have artificial nails or gel coating that need to be removed by a nail salon, please have this removed prior to surgery. Artificial nails or gel coating may interfere with anesthesia's ability to adequately monitor your vital signs.  Wear Clean/Comfortable clothing the morning of surgery Remember to brush your teeth WITH YOUR REGULAR TOOTHPASTE.   Please read over the following fact sheets that you were given.    If you received a COVID test during your pre-op visit  it is requested that you wear a mask when out in public, stay away from anyone that may not be feeling well and notify your surgeon if you develop symptoms. If you have been in contact with anyone that has tested positive in the last 10 days please notify you surgeon.

## 2022-07-01 ENCOUNTER — Other Ambulatory Visit: Payer: Self-pay

## 2022-07-01 ENCOUNTER — Encounter (HOSPITAL_COMMUNITY)
Admission: RE | Admit: 2022-07-01 | Discharge: 2022-07-01 | Disposition: A | Discharge status: Home / Self Care | Payer: Medicare Other | Source: Ambulatory Visit | Attending: Plastic Surgery | Admitting: Plastic Surgery

## 2022-07-01 ENCOUNTER — Encounter (HOSPITAL_COMMUNITY): Payer: Self-pay

## 2022-07-01 DIAGNOSIS — Z01818 Encounter for other preprocedural examination: Secondary | ICD-10-CM | POA: Diagnosis present

## 2022-07-01 DIAGNOSIS — I2510 Atherosclerotic heart disease of native coronary artery without angina pectoris: Secondary | ICD-10-CM | POA: Insufficient documentation

## 2022-07-01 HISTORY — DX: Paralytic syndrome, unspecified: G83.9

## 2022-07-01 LAB — CBC
HCT: 43.3 % (ref 39.0–52.0)
Hemoglobin: 14.5 g/dL (ref 13.0–17.0)
MCH: 31 pg (ref 26.0–34.0)
MCHC: 33.5 g/dL (ref 30.0–36.0)
MCV: 92.7 fL (ref 80.0–100.0)
Platelets: 165 10*3/uL (ref 150–400)
RBC: 4.67 MIL/uL (ref 4.22–5.81)
RDW: 12.4 % (ref 11.5–15.5)
WBC: 8.6 10*3/uL (ref 4.0–10.5)
nRBC: 0 % (ref 0.0–0.2)

## 2022-07-01 LAB — BASIC METABOLIC PANEL
Anion gap: 8 (ref 5–15)
BUN: 17 mg/dL (ref 8–23)
CO2: 29 mmol/L (ref 22–32)
Calcium: 9.3 mg/dL (ref 8.9–10.3)
Chloride: 103 mmol/L (ref 98–111)
Creatinine, Ser: 0.96 mg/dL (ref 0.61–1.24)
GFR, Estimated: >60 mL/min (ref >60)
Glucose, Bld: 132 mg/dL — ABNORMAL HIGH (ref 70–99)
Potassium: 3.8 mmol/L (ref 3.5–5.1)
Sodium: 140 mmol/L (ref 135–145)

## 2022-07-01 NOTE — Progress Notes (Signed)
PCP - Dr. Prince Solian Cardiologist - Denies  PPM/ICD - Denies Device Orders - n/a Rep Notified - n/a  Chest x-ray - n/a EKG - 07/01/2022 Stress Test - Denies ECHO - Denies Cardiac Cath - Denies  Sleep Study - Denies  CPAP - n/a  No DM  Blood Thinner Instructions: n/a Aspirin Instructions: Per daughter, ASA has already stopped per surgeons instructions  NPO after midnight  COVID TEST- n/a   Anesthesia review: Yes. Abnormal EKG at PAT appointment. Pt asymptomatic. Discussed with Karoline Caldwell, PA-C. Copy of EKG given to pt and daughter. Instructions to follow-up with Dr. Dagmar Hait regarding EKG result. Daughter and pt understood instructions.  Patient denies shortness of breath, fever, cough and chest pain at PAT appointment   All instructions explained to the patient, with a verbal understanding of the material. Patient agrees to go over the instructions while at home for a better understanding. Patient also instructed to self quarantine after being tested for COVID-19. The opportunity to ask questions was provided.

## 2022-07-02 NOTE — Anesthesia Preprocedure Evaluation (Addendum)
Anesthesia Evaluation  Patient identified by MRN, date of birth, ID band Patient awake    Reviewed: Allergy & Precautions, NPO status , Patient's Chart, lab work & pertinent test results  Airway Mallampati: III  TM Distance: >3 FB Neck ROM: Full    Dental  (+) Dental Advisory Given, Poor Dentition, Missing   Pulmonary former smoker,    Pulmonary exam normal breath sounds clear to auscultation       Cardiovascular hypertension, Pt. on medications Normal cardiovascular exam Rhythm:Regular Rate:Normal     Neuro/Psych  Headaches, PSYCHIATRIC DISORDERS Dementia    GI/Hepatic hiatal hernia, GERD  Medicated,Gilbert's syndrome   Endo/Other  negative endocrine ROS  Renal/GU negative Renal ROS     Musculoskeletal  (+) Arthritis ,   Abdominal   Peds  Hematology negative hematology ROS (+)   Anesthesia Other Findings   Reproductive/Obstetrics                           Anesthesia Physical Anesthesia Plan  ASA: 3  Anesthesia Plan: General   Post-op Pain Management: Tylenol PO (pre-op)*   Induction: Intravenous  PONV Risk Score and Plan: 2 and Dexamethasone and Ondansetron  Airway Management Planned: Oral ETT  Additional Equipment:   Intra-op Plan:   Post-operative Plan: Extubation in OR  Informed Consent: I have reviewed the patients History and Physical, chart, labs and discussed the procedure including the risks, benefits and alternatives for the proposed anesthesia with the patient or authorized representative who has indicated his/her understanding and acceptance.     Dental advisory given  Plan Discussed with: CRNA  Anesthesia Plan Comments: (PAT note by Karoline Caldwell, PA-C: Preop EKG notable for ventricular bigeminy which appears to be new.  Patient asymptomatic, denies palpitations, denies shortness of breath, chest pain, or other cardiopulmonary complaint.  He denies any history  of CAD and is overall quite healthy at age 88.  He was seen by his PCP Dr. Elsworth Soho at [facility] on 05/01/2022 for preop evaluation.  Per note, "His review of systems is fairly unremarkable and given minimally invasive surgery-no additional cardiovascular testing necessary Based on history and exam today and given lack of risk factors and adequate functional capacity, pt is at low risk for surgical complications."  I reviewed EKG with anesthesiologist Dr. Deatra Canter.  He advised okay to proceed with surgery as planned barring acute status change.  I did also forward EKG to patient's PCP for his records/review..  Preop labs reviewed, WNL. )      Anesthesia Quick Evaluation

## 2022-07-02 NOTE — Progress Notes (Signed)
Anesthesia Chart Review:  Preop EKG notable for ventricular bigeminy which appears to be new.  Patient asymptomatic, denies palpitations, denies shortness of breath, chest pain, or other cardiopulmonary complaint.  He denies any history of CAD and is overall quite healthy at age 88.  He was seen by his PCP Dr. Elsworth Soho at [facility] on 05/01/2022 for preop evaluation.  Per note, "His review of systems is fairly unremarkable and given minimally invasive surgery-no additional cardiovascular testing necessary Based on history and exam today and given lack of risk factors and adequate functional capacity, pt is at low risk for surgical complications."  I reviewed EKG with anesthesiologist Dr. Deatra Canter.  He advised okay to proceed with surgery as planned barring acute status change.  I did also forward EKG to patient's PCP for his records/review..  Preop labs reviewed, WNL.  Wynonia Musty [facility] Short Stay Center/Anesthesiology Phone [phone] 07/02/2022 4:28 PM

## 2022-07-08 ENCOUNTER — Other Ambulatory Visit: Payer: Self-pay

## 2022-07-08 ENCOUNTER — Ambulatory Visit (HOSPITAL_COMMUNITY)
Admission: RE | Admit: 2022-07-08 | Discharge: 2022-07-08 | Disposition: A | Discharge status: Home / Self Care | Payer: Medicare Other | Attending: Plastic Surgery | Admitting: Plastic Surgery

## 2022-07-08 ENCOUNTER — Encounter (HOSPITAL_COMMUNITY): Payer: Self-pay | Admitting: Plastic Surgery

## 2022-07-08 ENCOUNTER — Encounter (HOSPITAL_COMMUNITY)
Admission: RE | Disposition: A | Discharge status: Home / Self Care | Payer: Self-pay | Source: Home / Self Care | Attending: Plastic Surgery

## 2022-07-08 ENCOUNTER — Ambulatory Visit (HOSPITAL_BASED_OUTPATIENT_CLINIC_OR_DEPARTMENT_OTHER): Payer: Medicare Other | Admitting: Physician Assistant

## 2022-07-08 ENCOUNTER — Ambulatory Visit (HOSPITAL_COMMUNITY): Payer: Medicare Other | Admitting: Physician Assistant

## 2022-07-08 DIAGNOSIS — K219 Gastro-esophageal reflux disease without esophagitis: Secondary | ICD-10-CM | POA: Diagnosis not present

## 2022-07-08 DIAGNOSIS — K449 Diaphragmatic hernia without obstruction or gangrene: Secondary | ICD-10-CM | POA: Insufficient documentation

## 2022-07-08 DIAGNOSIS — Q670 Congenital facial asymmetry: Secondary | ICD-10-CM | POA: Diagnosis not present

## 2022-07-08 DIAGNOSIS — F0390 Unspecified dementia without behavioral disturbance: Secondary | ICD-10-CM | POA: Diagnosis not present

## 2022-07-08 DIAGNOSIS — G510 Bell's palsy: Secondary | ICD-10-CM | POA: Diagnosis not present

## 2022-07-08 DIAGNOSIS — I10 Essential (primary) hypertension: Secondary | ICD-10-CM

## 2022-07-08 DIAGNOSIS — Z7982 Long term (current) use of aspirin: Secondary | ICD-10-CM | POA: Insufficient documentation

## 2022-07-08 DIAGNOSIS — Z87891 Personal history of nicotine dependence: Secondary | ICD-10-CM | POA: Diagnosis not present

## 2022-07-08 HISTORY — PX: ADJACENT TISSUE TRANSFER/TISSUE REARRANGEMENT: SHX6829

## 2022-07-08 SURGERY — ADJACENT TISSUE TRANSFER
Anesthesia: General | Site: Face | Laterality: Right

## 2022-07-08 MED ORDER — CHLORHEXIDINE GLUCONATE 0.12 % MT SOLN
15.0000 mL | Freq: Once | OROMUCOSAL | Status: AC
  Administered 2022-07-08: 15 mL via OROMUCOSAL
  Filled 2022-07-08: qty 15

## 2022-07-08 MED ORDER — CHLORHEXIDINE GLUCONATE CLOTH 2 % EX PADS: 6.0000 | MEDICATED_PAD | Freq: Once | CUTANEOUS | Status: DC

## 2022-07-08 MED ORDER — PHENYLEPHRINE 80 MCG/ML (10ML) SYRINGE FOR IV PUSH (FOR BLOOD PRESSURE SUPPORT)
PREFILLED_SYRINGE | INTRAVENOUS | Status: AC
  Filled 2022-07-08: qty 10

## 2022-07-08 MED ORDER — LACTATED RINGERS IV SOLN: INTRAVENOUS | Status: DC | PRN

## 2022-07-08 MED ORDER — BACITRACIN ZINC 500 UNIT/GM EX OINT
TOPICAL_OINTMENT | CUTANEOUS | Status: DC | PRN
  Administered 2022-07-08: 1 via TOPICAL

## 2022-07-08 MED ORDER — PROPOFOL 10 MG/ML IV BOLUS
INTRAVENOUS | Status: AC
  Filled 2022-07-08: qty 20

## 2022-07-08 MED ORDER — 0.9 % SODIUM CHLORIDE (POUR BTL) OPTIME
TOPICAL | Status: DC | PRN
  Administered 2022-07-08: 1000 mL

## 2022-07-08 MED ORDER — SUGAMMADEX SODIUM 200 MG/2ML IV SOLN
INTRAVENOUS | Status: DC | PRN
  Administered 2022-07-08: 100 mg via INTRAVENOUS

## 2022-07-08 MED ORDER — ONDANSETRON HCL 4 MG/2ML IJ SOLN: 4.0000 mg | Freq: Once | INTRAMUSCULAR | Status: DC | PRN

## 2022-07-08 MED ORDER — ORAL CARE MOUTH RINSE: 15.0000 mL | Freq: Once | OROMUCOSAL | Status: AC

## 2022-07-08 MED ORDER — FENTANYL CITRATE (PF) 100 MCG/2ML IJ SOLN
INTRAMUSCULAR | Status: AC
  Filled 2022-07-08: qty 2

## 2022-07-08 MED ORDER — LIDOCAINE-EPINEPHRINE 1 %-1:100000 IJ SOLN
INTRAMUSCULAR | Status: AC
  Filled 2022-07-08: qty 1

## 2022-07-08 MED ORDER — ACETAMINOPHEN 500 MG PO TABS
1000.0000 mg | ORAL_TABLET | Freq: Once | ORAL | Status: AC
  Administered 2022-07-08: 1000 mg via ORAL
  Filled 2022-07-08: qty 2

## 2022-07-08 MED ORDER — DEXAMETHASONE SODIUM PHOSPHATE 10 MG/ML IJ SOLN
INTRAMUSCULAR | Status: DC | PRN
  Administered 2022-07-08: 5 mg via INTRAVENOUS

## 2022-07-08 MED ORDER — FENTANYL CITRATE (PF) 250 MCG/5ML IJ SOLN
INTRAMUSCULAR | Status: DC | PRN
  Administered 2022-07-08: 50 ug via INTRAVENOUS

## 2022-07-08 MED ORDER — LIDOCAINE 2% (20 MG/ML) 5 ML SYRINGE
INTRAMUSCULAR | Status: DC | PRN
  Administered 2022-07-08: 80 mg via INTRAVENOUS

## 2022-07-08 MED ORDER — LACTATED RINGERS IV SOLN: INTRAVENOUS | Status: DC

## 2022-07-08 MED ORDER — STERILE WATER FOR IRRIGATION IR SOLN
Status: DC | PRN
  Administered 2022-07-08: 1000 mL

## 2022-07-08 MED ORDER — ONDANSETRON HCL 4 MG/2ML IJ SOLN
INTRAMUSCULAR | Status: AC
  Filled 2022-07-08: qty 2

## 2022-07-08 MED ORDER — LIDOCAINE-EPINEPHRINE 1 %-1:100000 IJ SOLN
INTRAMUSCULAR | Status: DC | PRN
  Administered 2022-07-08: 20 mL

## 2022-07-08 MED ORDER — FENTANYL CITRATE (PF) 100 MCG/2ML IJ SOLN
25.0000 ug | INTRAMUSCULAR | Status: DC | PRN
  Administered 2022-07-08: 25 ug via INTRAVENOUS

## 2022-07-08 MED ORDER — EPHEDRINE SULFATE-NACL 50-0.9 MG/10ML-% IV SOSY
PREFILLED_SYRINGE | INTRAVENOUS | Status: DC | PRN
  Administered 2022-07-08: 5 mg via INTRAVENOUS

## 2022-07-08 MED ORDER — PHENYLEPHRINE HCL (PRESSORS) 10 MG/ML IV SOLN
INTRAVENOUS | Status: DC | PRN
  Administered 2022-07-08 (×2): 160 ug via INTRAVENOUS

## 2022-07-08 MED ORDER — FENTANYL CITRATE (PF) 250 MCG/5ML IJ SOLN
INTRAMUSCULAR | Status: AC
  Filled 2022-07-08: qty 5

## 2022-07-08 MED ORDER — CEFAZOLIN SODIUM-DEXTROSE 2-4 GM/100ML-% IV SOLN
2.0000 g | INTRAVENOUS | Status: AC
  Administered 2022-07-08: 2 g via INTRAVENOUS
  Filled 2022-07-08: qty 100

## 2022-07-08 MED ORDER — DEXAMETHASONE SODIUM PHOSPHATE 10 MG/ML IJ SOLN
INTRAMUSCULAR | Status: AC
  Filled 2022-07-08: qty 1

## 2022-07-08 MED ORDER — EPHEDRINE 5 MG/ML INJ
INTRAVENOUS | Status: AC
  Filled 2022-07-08: qty 5

## 2022-07-08 MED ORDER — BACITRACIN ZINC 500 UNIT/GM EX OINT
TOPICAL_OINTMENT | CUTANEOUS | Status: AC
  Filled 2022-07-08: qty 28.35

## 2022-07-08 MED ORDER — ROCURONIUM BROMIDE 10 MG/ML (PF) SYRINGE
PREFILLED_SYRINGE | INTRAVENOUS | Status: DC | PRN
  Administered 2022-07-08: 50 mg via INTRAVENOUS

## 2022-07-08 MED ORDER — ONDANSETRON HCL 4 MG/2ML IJ SOLN
INTRAMUSCULAR | Status: DC | PRN
  Administered 2022-07-08: 4 mg via INTRAVENOUS

## 2022-07-08 MED ORDER — PROPOFOL 10 MG/ML IV BOLUS
INTRAVENOUS | Status: DC | PRN
  Administered 2022-07-08: 70 mg via INTRAVENOUS

## 2022-07-08 SURGICAL SUPPLY — 29 items
BETADINE 5% OPHTHALMIC (OPHTHALMIC) IMPLANT
CANISTER SUCT 3000ML PPV (MISCELLANEOUS) ×1 IMPLANT
COVER SURGICAL LIGHT HANDLE (MISCELLANEOUS) ×1 IMPLANT
DRAPE ORTHO SPLIT 77X108 STRL (DRAPES) ×1
DRAPE SURG ORHT 6 SPLT 77X108 (DRAPES) ×2 IMPLANT
ELECT COATED BLADE 2.86 ST (ELECTRODE) IMPLANT
ELECT NDL BLADE 2-5/6 (NEEDLE) IMPLANT
ELECT NEEDLE BLADE 2-5/6 (NEEDLE) ×1 IMPLANT
ELECT REM PT RETURN 9FT ADLT (ELECTROSURGICAL) ×1
ELECTRODE REM PT RTRN 9FT ADLT (ELECTROSURGICAL) ×1 IMPLANT
GAUZE 4X4 16PLY ~~LOC~~+RFID DBL (SPONGE) IMPLANT
GLOVE BIO SURGEON STRL SZ7.5 (GLOVE) IMPLANT
GLOVE BIOGEL M STRL SZ7.5 (GLOVE) IMPLANT
GOWN STRL REUS W/ TWL LRG LVL3 (GOWN DISPOSABLE) ×2 IMPLANT
GOWN STRL REUS W/TWL LRG LVL3 (GOWN DISPOSABLE) ×2
KIT BASIN OR (CUSTOM PROCEDURE TRAY) ×1 IMPLANT
KIT TURNOVER KIT B (KITS) ×1 IMPLANT
OPHTHALMIC BETADINE 5% (OPHTHALMIC) ×1
PACK GENERAL/GYN (CUSTOM PROCEDURE TRAY) ×1 IMPLANT
PAD ARMBOARD 7.5X6 YLW CONV (MISCELLANEOUS) ×1 IMPLANT
RETRACTOR ONETRAX LX 90X20 (MISCELLANEOUS) IMPLANT
STAPLER VISISTAT 35W (STAPLE) IMPLANT
SUT MON AB 4-0 PS1 27 (SUTURE) IMPLANT
SUT PROLENE 5 0 PS 2 (SUTURE) IMPLANT
SUT VIC AB 3-0 SH 27 (SUTURE) ×2
SUT VIC AB 3-0 SH 27X BRD (SUTURE) IMPLANT
SYR CONTROL 10ML LL (SYRINGE) IMPLANT
TOWEL GREEN STERILE (TOWEL DISPOSABLE) ×1 IMPLANT
TOWEL GREEN STERILE FF (TOWEL DISPOSABLE) ×1 IMPLANT

## 2022-07-08 NOTE — Op Note (Addendum)
Operative Note   DATE OF OPERATION: 07/08/2022  SURGICAL DEPARTMENT: Plastic Surgery  PREOPERATIVE DIAGNOSES:  right face asymmetry, Bell's Palsey  POSTOPERATIVE DIAGNOSES:  same  PROCEDURE:  6x8 cm local tissue rearrangement right face  SURGEON: Melene Plan. Tannar Broker, MD  ASSISTANT: Verdie Shire, PAC  ANESTHESIA:  General.   COMPLICATIONS: None.   INDICATIONS FOR PROCEDURE:  The patient, Brent Brennan is a 88 y.o. male born on June 16, 1931, is here for treatment of right facial paralysis MRN: [id number]  CONSENT:  Informed consent was obtained directly from the patient. Risks, benefits and alternatives were fully discussed. Specific risks including but not limited to bleeding, infection, hematoma, seroma, scarring, pain, contracture, asymmetry, wound healing problems, and need for further surgery were all discussed. The patient did have an ample opportunity to have questions answered to satisfaction.   DESCRIPTION OF PROCEDURE:  The patient was taken to the operating room. SCDs were placed and preop antibiotics were given. General anesthesia was administered.  The patient's operative site was prepped and draped in a sterile fashion. A time out was performed and all information was confirmed to be correct.    The right face was injected with 1% lidocaine with epinephrine.  A preauricular incision was made with a 15 blade.  A combination of sharp and blunt dissection was performed to raise a right facial flap in a subcutaneous plane.  The skin was rotated and advanced superiorly and laterally.  The SMAS was plicated with interrupted 3-0 Vicryl sutures.  Skin was removed by taylor tacking.  4-0 Monocryl and nylon where used to suture the skin in a layered fashioned.    The advanced practice practitioner (APP) assisted throughout the case.  The APP was essential in retraction and counter traction when needed to make the case progress smoothly.  This retraction and assistance made it possible  to see the tissue planes for the procedure.  The assistance was needed for hemostasis, tissue re-approximation and closure of the incision site.   The patient tolerated the procedure well.  There were no complications. The patient was allowed to wake from anesthesia, extubated and taken to the recovery room in satisfactory condition.

## 2022-07-08 NOTE — Anesthesia Procedure Notes (Signed)
Procedure Name: Intubation Date/Time: 07/08/2022 10:06 AM  Performed by: Lavell Luster, CRNAPre-anesthesia Checklist: Patient identified, Emergency Drugs available, Suction available, Patient being monitored and Timeout performed Patient Re-evaluated:Patient Re-evaluated prior to induction Oxygen Delivery Method: Circle system utilized Preoxygenation: Pre-oxygenation with 100% oxygen Induction Type: IV induction Ventilation: Mask ventilation without difficulty Laryngoscope Size: Mac and 3 Grade View: Grade I Tube type: Oral Tube size: 7.5 mm Number of attempts: 1 Airway Equipment and Method: Stylet Placement Confirmation: positive ETCO2, breath sounds checked- equal and bilateral and ETT inserted through vocal cords under direct vision Tube secured with: Tape Dental Injury: Teeth and Oropharynx as per pre-operative assessment

## 2022-07-08 NOTE — Discharge Instructions (Addendum)
Activity as tolerated. NO showers for 24 hours NO driving No heavy activities  Diet: Regular. Low sugar/carbs Wound Care: Mild drainage is normal.Lightly wipe away any drainage as needed. Bacitracin antibiotic ointment was applied to the area. You may reapply normal vaseline as needed.  Call doctor if any unusual problems occur such as pain, excessive bleeding, unrelieved nausea/vomiting, fever &/or chills  Follow-up appointment: Previously scheduled.

## 2022-07-08 NOTE — Interval H&P Note (Signed)
History and Physical Interval Note:  07/08/2022 9:26 AM  Brent Brennan  has presented today for surgery, with the diagnosis of Tissue rearrangement and advancement right face.  The various methods of treatment have been discussed with the patient and family. After consideration of risks, benefits and other options for treatment, the patient has consented to  Procedure(s) with comments: ADJACENT TISSUE TRANSFER/TISSUE REARRANGEMENT (Right) - 2 hours as a surgical intervention.  The patient's history has been reviewed, patient examined, no change in status, stable for surgery.  I have reviewed the patient's chart and labs.  Questions were answered to the patient's satisfaction.     Lennice Sites

## 2022-07-08 NOTE — Transfer of Care (Signed)
Immediate Anesthesia Transfer of Care Note  Patient: Brent Brennan  Procedure(s) Performed: ADJACENT TISSUE TRANSFER/LOCAL TISSUE REARRANGEMENT OF RIGHT FACE (Right: Face)  Patient Location: PACU  Anesthesia Type:General  Level of Consciousness: awake, drowsy and patient cooperative  Airway & Oxygen Therapy: Patient Spontanous Breathing  Post-op Assessment: Report given to RN and Post -op Vital signs reviewed and stable  Post vital signs: Reviewed and stable  Last Vitals:  Vitals Value Taken Time  BP 171/77 07/08/22 1053  Temp    Pulse 80 07/08/22 1053  Resp 11 07/08/22 1053  SpO2 96 % 07/08/22 1053  Vitals shown include unvalidated device data.  Last Pain:  Vitals:   07/08/22 0803  TempSrc:   PainSc: 0-No pain      Patients Stated Pain Goal: 0 (26/20/35 5974)  Complications: No notable events documented.

## 2022-07-09 ENCOUNTER — Encounter (HOSPITAL_COMMUNITY): Payer: Self-pay | Admitting: Plastic Surgery

## 2022-07-09 NOTE — Anesthesia Postprocedure Evaluation (Signed)
Anesthesia Post Note  Patient: GURNEY BALTHAZOR  Procedure(s) Performed: ADJACENT TISSUE TRANSFER/LOCAL TISSUE REARRANGEMENT OF RIGHT FACE (Right: Face)     Patient location during evaluation: PACU Anesthesia Type: General Level of consciousness: awake and alert Pain management: pain level controlled Vital Signs Assessment: post-procedure vital signs reviewed and stable Respiratory status: spontaneous breathing, nonlabored ventilation, respiratory function stable and patient connected to nasal cannula oxygen Cardiovascular status: blood pressure returned to baseline and stable Postop Assessment: no apparent nausea or vomiting Anesthetic complications: no   No notable events documented.  Last Vitals:  Vitals:   07/08/22 1130 07/08/22 1145  BP: (!) 152/72 138/67  Pulse: 70 69  Resp: 19 18  Temp:    SpO2: 100% 97%    Last Pain:  Vitals:   07/08/22 1145  TempSrc:   PainSc: [place]

## 2022-07-19 ENCOUNTER — Ambulatory Visit (INDEPENDENT_AMBULATORY_CARE_PROVIDER_SITE_OTHER): Payer: Medicare Other | Admitting: Physician Assistant

## 2022-07-19 DIAGNOSIS — G510 Bell's palsy: Secondary | ICD-10-CM

## 2022-07-19 NOTE — Progress Notes (Signed)
This is a 88 year old gentleman seen in our office for postop follow-up status post right-sided local tissue arrangement for right-sided facial asymmetry secondary to Bell's palsy on 07/08/2022 by Dr. Erin Hearing.  Postoperatively the patient notes he has done very well with no issues or concerns.  He is accompanied by his daughter and notes that they have been applying the bacitracin ointment.  He denies any infectious symptoms.  The patient notes that his drooling has gone away and is very happy with his surgical outcome.  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        Overall the patient is very happy, he notes this has improved his drooling.  There are no issues or concerns with the incision.  I did remove the sutures today.  They will continue using the bacitracin or Vaseline on the incision, at this time no further follow-up necessary, they may follow-up on a as needed basis.  I did give him strict return precautions.  Both the patient and his daughter verbalized understanding and agreement to today's plan had no further questions or concerns.

## 2022-09-04 ENCOUNTER — Other Ambulatory Visit: Payer: Self-pay | Admitting: Gastroenterology

## 2022-10-31 ENCOUNTER — Other Ambulatory Visit: Payer: Self-pay | Admitting: Gastroenterology

## 2023-05-30 ENCOUNTER — Encounter (HOSPITAL_COMMUNITY): Payer: Self-pay | Admitting: *Deleted

## 2023-05-30 ENCOUNTER — Emergency Department (HOSPITAL_COMMUNITY): Payer: Medicare Other

## 2023-05-30 ENCOUNTER — Inpatient Hospital Stay (HOSPITAL_COMMUNITY)
Admission: EM | Admit: 2023-05-30 | Discharge: 2023-06-05 | DRG: 177 | Disposition: A | Discharge status: Hospice | Payer: Medicare Other | Attending: Internal Medicine | Admitting: Internal Medicine

## 2023-05-30 ENCOUNTER — Other Ambulatory Visit: Payer: Self-pay

## 2023-05-30 DIAGNOSIS — Z1152 Encounter for screening for COVID-19: Secondary | ICD-10-CM | POA: Diagnosis not present

## 2023-05-30 DIAGNOSIS — F05 Delirium due to known physiological condition: Secondary | ICD-10-CM | POA: Diagnosis not present

## 2023-05-30 DIAGNOSIS — R636 Underweight: Secondary | ICD-10-CM | POA: Diagnosis present

## 2023-05-30 DIAGNOSIS — A419 Sepsis, unspecified organism: Secondary | ICD-10-CM

## 2023-05-30 DIAGNOSIS — E876 Hypokalemia: Secondary | ICD-10-CM | POA: Diagnosis present

## 2023-05-30 DIAGNOSIS — J9601 Acute respiratory failure with hypoxia: Secondary | ICD-10-CM | POA: Diagnosis present

## 2023-05-30 DIAGNOSIS — K222 Esophageal obstruction: Secondary | ICD-10-CM | POA: Diagnosis present

## 2023-05-30 DIAGNOSIS — Z66 Do not resuscitate: Secondary | ICD-10-CM | POA: Diagnosis present

## 2023-05-30 DIAGNOSIS — R1313 Dysphagia, pharyngeal phase: Secondary | ICD-10-CM | POA: Diagnosis present

## 2023-05-30 DIAGNOSIS — R627 Adult failure to thrive: Secondary | ICD-10-CM | POA: Diagnosis present

## 2023-05-30 DIAGNOSIS — K219 Gastro-esophageal reflux disease without esophagitis: Secondary | ICD-10-CM | POA: Diagnosis present

## 2023-05-30 DIAGNOSIS — F0390 Unspecified dementia without behavioral disturbance: Secondary | ICD-10-CM

## 2023-05-30 DIAGNOSIS — F32A Depression, unspecified: Secondary | ICD-10-CM | POA: Diagnosis present

## 2023-05-30 DIAGNOSIS — J189 Pneumonia, unspecified organism: Secondary | ICD-10-CM | POA: Diagnosis not present

## 2023-05-30 DIAGNOSIS — Z8249 Family history of ischemic heart disease and other diseases of the circulatory system: Secondary | ICD-10-CM

## 2023-05-30 DIAGNOSIS — Z87891 Personal history of nicotine dependence: Secondary | ICD-10-CM

## 2023-05-30 DIAGNOSIS — R0902 Hypoxemia: Principal | ICD-10-CM

## 2023-05-30 DIAGNOSIS — I159 Secondary hypertension, unspecified: Secondary | ICD-10-CM | POA: Diagnosis not present

## 2023-05-30 DIAGNOSIS — Z8719 Personal history of other diseases of the digestive system: Secondary | ICD-10-CM

## 2023-05-30 DIAGNOSIS — I10 Essential (primary) hypertension: Secondary | ICD-10-CM | POA: Diagnosis present

## 2023-05-30 DIAGNOSIS — J690 Pneumonitis due to inhalation of food and vomit: Secondary | ICD-10-CM | POA: Diagnosis present

## 2023-05-30 DIAGNOSIS — Z7982 Long term (current) use of aspirin: Secondary | ICD-10-CM | POA: Diagnosis not present

## 2023-05-30 DIAGNOSIS — Z515 Encounter for palliative care: Secondary | ICD-10-CM

## 2023-05-30 DIAGNOSIS — F0393 Unspecified dementia, unspecified severity, with mood disturbance: Secondary | ICD-10-CM | POA: Diagnosis present

## 2023-05-30 DIAGNOSIS — Z79899 Other long term (current) drug therapy: Secondary | ICD-10-CM

## 2023-05-30 DIAGNOSIS — Z820 Family history of epilepsy and other diseases of the nervous system: Secondary | ICD-10-CM

## 2023-05-30 DIAGNOSIS — Z7189 Other specified counseling: Secondary | ICD-10-CM | POA: Diagnosis not present

## 2023-05-30 DIAGNOSIS — F03911 Unspecified dementia, unspecified severity, with agitation: Secondary | ICD-10-CM | POA: Diagnosis present

## 2023-05-30 DIAGNOSIS — Z681 Body mass index (BMI) 19 or less, adult: Secondary | ICD-10-CM | POA: Diagnosis not present

## 2023-05-30 LAB — RESPIRATORY PANEL BY PCR

## 2023-05-30 LAB — COMPREHENSIVE METABOLIC PANEL
ALT: 10 U/L (ref 0–44)
AST: 30 U/L (ref 15–41)
Albumin: 3.3 g/dL — ABNORMAL LOW (ref 3.5–5.0)
Alkaline Phosphatase: 63 U/L (ref 38–126)
Anion gap: 13 (ref 5–15)
BUN: 26 mg/dL — ABNORMAL HIGH (ref 8–23)
CO2: 30 mmol/L (ref 22–32)
Calcium: 9.3 mg/dL (ref 8.9–10.3)
Chloride: 96 mmol/L — ABNORMAL LOW (ref 98–111)
Creatinine, Ser: 1 mg/dL (ref 0.61–1.24)
GFR, Estimated: >60 mL/min (ref >60)
Glucose, Bld: 123 mg/dL — ABNORMAL HIGH (ref 70–99)
Potassium: 4.2 mmol/L (ref 3.5–5.1)
Sodium: 139 mmol/L (ref 135–145)
Total Bilirubin: 2.3 mg/dL — ABNORMAL HIGH (ref 0.3–1.2)
Total Protein: 6 g/dL — ABNORMAL LOW (ref 6.5–8.1)

## 2023-05-30 LAB — CBC
HCT: 33.9 % — ABNORMAL LOW (ref 39.0–52.0)
Hemoglobin: 10.8 g/dL — ABNORMAL LOW (ref 13.0–17.0)
MCH: 30.2 pg (ref 26.0–34.0)
MCHC: 31.9 g/dL (ref 30.0–36.0)
MCV: 94.7 fL (ref 80.0–100.0)
Platelets: 95 10*3/uL — ABNORMAL LOW (ref 150–400)
RBC: 3.58 MIL/uL — ABNORMAL LOW (ref 4.22–5.81)
RDW: 13.2 % (ref 11.5–15.5)
WBC: 7.8 10*3/uL (ref 4.0–10.5)
nRBC: 0 % (ref 0.0–0.2)

## 2023-05-30 LAB — BRAIN NATRIURETIC PEPTIDE: B Natriuretic Peptide: 413.7 pg/mL — ABNORMAL HIGH (ref 0.0–100.0)

## 2023-05-30 LAB — CBC WITH DIFFERENTIAL/PLATELET
Abs Immature Granulocytes: 0.03 10*3/uL (ref 0.00–0.07)
Basophils Absolute: 0 10*3/uL (ref 0.0–0.1)
Basophils Relative: 0 %
Eosinophils Absolute: 0 10*3/uL (ref 0.0–0.5)
Eosinophils Relative: 0 %
HCT: 39.4 % (ref 39.0–52.0)
Hemoglobin: 12.6 g/dL — ABNORMAL LOW (ref 13.0–17.0)
Immature Granulocytes: 0 %
Lymphocytes Relative: 7 %
Lymphs Abs: 0.7 10*3/uL (ref 0.7–4.0)
MCH: 30.1 pg (ref 26.0–34.0)
MCHC: 32 g/dL (ref 30.0–36.0)
MCV: 94.3 fL (ref 80.0–100.0)
Monocytes Absolute: 0.5 10*3/uL (ref 0.1–1.0)
Monocytes Relative: 5 %
Neutro Abs: 8.9 10*3/uL — ABNORMAL HIGH (ref 1.7–7.7)
Neutrophils Relative %: 88 %
Platelets: 118 10*3/uL — ABNORMAL LOW (ref 150–400)
RBC: 4.18 MIL/uL — ABNORMAL LOW (ref 4.22–5.81)
RDW: 13.3 % (ref 11.5–15.5)
WBC: 10.2 10*3/uL (ref 4.0–10.5)
nRBC: 0 % (ref 0.0–0.2)

## 2023-05-30 LAB — PROCALCITONIN: Procalcitonin: 0.54 ng/mL

## 2023-05-30 LAB — APTT: aPTT: 31 seconds (ref 24–36)

## 2023-05-30 LAB — I-STAT CG4 LACTIC ACID, ED
Lactic Acid, Venous: 2 mmol/L (ref 0.5–1.9)
Lactic Acid, Venous: 1.2 mmol/L (ref 0.5–1.9)

## 2023-05-30 LAB — RESP PANEL BY RT-PCR (RSV, FLU A&B, COVID)  RVPGX2
Influenza A by PCR: NEGATIVE
Influenza B by PCR: NEGATIVE
Resp Syncytial Virus by PCR: NEGATIVE
SARS Coronavirus 2 by RT PCR: NEGATIVE

## 2023-05-30 LAB — PROTIME-INR
INR: 1.6 — ABNORMAL HIGH (ref 0.8–1.2)
Prothrombin Time: 19.1 seconds — ABNORMAL HIGH (ref 11.4–15.2)

## 2023-05-30 LAB — CREATININE, SERUM
Creatinine, Ser: 0.91 mg/dL (ref 0.61–1.24)
GFR, Estimated: >60 mL/min (ref >60)

## 2023-05-30 LAB — C-REACTIVE PROTEIN: CRP: 17 mg/dL — ABNORMAL HIGH (ref <1.0)

## 2023-05-30 MED ORDER — ADULT MULTIVITAMIN W/MINERALS CH: 1.0000 | ORAL_TABLET | Freq: Every day | ORAL | Status: DC

## 2023-05-30 MED ORDER — ACETAMINOPHEN 650 MG RE SUPP: 650.0000 mg | Freq: Four times a day (QID) | RECTAL | Status: DC | PRN

## 2023-05-30 MED ORDER — TRAMADOL HCL 50 MG PO TABS: 50.0000 mg | ORAL_TABLET | Freq: Four times a day (QID) | ORAL | Status: DC | PRN

## 2023-05-30 MED ORDER — LACTATED RINGERS IV SOLN: INTRAVENOUS | Status: DC

## 2023-05-30 MED ORDER — ACETAMINOPHEN 325 MG PO TABS: 650.0000 mg | ORAL_TABLET | Freq: Four times a day (QID) | ORAL | Status: DC | PRN

## 2023-05-30 MED ORDER — LACTATED RINGERS IV BOLUS (SEPSIS)
1000.0000 mL | Freq: Once | INTRAVENOUS | Status: AC
  Administered 2023-05-30: 1000 mL via INTRAVENOUS

## 2023-05-30 MED ORDER — VITAMIN C 500 MG PO TABS: 1000.0000 mg | ORAL_TABLET | Freq: Every day | ORAL | Status: DC

## 2023-05-30 MED ORDER — POLYETHYLENE GLYCOL 3350 17 G PO PACK: 17.0000 g | PACK | Freq: Every day | ORAL | Status: DC | PRN

## 2023-05-30 MED ORDER — SODIUM CHLORIDE 0.9 % IV SOLN
500.0000 mg | INTRAVENOUS | Status: DC
  Administered 2023-05-30: 500 mg via INTRAVENOUS
  Filled 2023-05-30 (×2): qty 5

## 2023-05-30 MED ORDER — SODIUM CHLORIDE 0.9 % IV SOLN: 2.0000 g | INTRAVENOUS | Status: DC

## 2023-05-30 MED ORDER — VITAMIN D 25 MCG (1000 UNIT) PO TABS: 2000.0000 [IU] | ORAL_TABLET | Freq: Every day | ORAL | Status: DC

## 2023-05-30 MED ORDER — LEVALBUTEROL HCL 0.63 MG/3ML IN NEBU
0.6300 mg | INHALATION_SOLUTION | Freq: Three times a day (TID) | RESPIRATORY_TRACT | Status: DC
  Administered 2023-05-30 – 2023-06-02 (×8): 0.63 mg via RESPIRATORY_TRACT
  Filled 2023-05-30 (×8): qty 3

## 2023-05-30 MED ORDER — ONDANSETRON HCL 4 MG PO TABS: 4.0000 mg | ORAL_TABLET | Freq: Four times a day (QID) | ORAL | Status: DC | PRN

## 2023-05-30 MED ORDER — ENOXAPARIN SODIUM 40 MG/0.4ML IJ SOSY
40.0000 mg | PREFILLED_SYRINGE | INTRAMUSCULAR | Status: DC
  Administered 2023-05-31 – 2023-06-03 (×4): 40 mg via SUBCUTANEOUS
  Filled 2023-05-30 (×4): qty 0.4

## 2023-05-30 MED ORDER — ESCITALOPRAM OXALATE 10 MG PO TABS: 10.0000 mg | ORAL_TABLET | Freq: Every day | ORAL | Status: DC

## 2023-05-30 MED ORDER — ONDANSETRON HCL 4 MG/2ML IJ SOLN: 4.0000 mg | Freq: Four times a day (QID) | INTRAMUSCULAR | Status: DC | PRN

## 2023-05-30 MED ORDER — MELATONIN 5 MG PO TABS: 5.0000 mg | ORAL_TABLET | Freq: Every evening | ORAL | Status: DC | PRN

## 2023-05-30 MED ORDER — GUAIFENESIN ER 600 MG PO TB12
600.0000 mg | ORAL_TABLET | Freq: Two times a day (BID) | ORAL | Status: DC
  Administered 2023-05-31: 600 mg via ORAL
  Filled 2023-05-30 (×2): qty 1

## 2023-05-30 MED ORDER — HYDRALAZINE HCL 20 MG/ML IJ SOLN: 10.0000 mg | Freq: Four times a day (QID) | INTRAMUSCULAR | Status: DC | PRN

## 2023-05-30 MED ORDER — CALCIUM CARBONATE 1500 (600 CA) MG PO TABS: 600.0000 mg | ORAL_TABLET | Freq: Every day | ORAL | Status: DC

## 2023-05-30 MED ORDER — MEMANTINE HCL 10 MG PO TABS
10.0000 mg | ORAL_TABLET | Freq: Two times a day (BID) | ORAL | Status: DC
  Filled 2023-05-30: qty 1

## 2023-05-30 MED ORDER — PANTOPRAZOLE SODIUM 40 MG PO TBEC
40.0000 mg | DELAYED_RELEASE_TABLET | Freq: Every day | ORAL | Status: DC
Start: 2023-05-30 — End: 2023-05-31

## 2023-05-30 MED ORDER — MIRTAZAPINE 15 MG PO TABS
15.0000 mg | ORAL_TABLET | Freq: Every day | ORAL | Status: DC
  Filled 2023-05-30: qty 1

## 2023-05-30 NOTE — ED Notes (Signed)
ED TO INPATIENT HANDOFF REPORT  ED Nurse Name and Phone #: Dahlia Client [phone]  S Name/Age/Gender Brent Brennan 89 y.o. male Room/Bed: 007C/007C  Code Status   Code Status: Not on file  Home/SNF/Other Home Patient oriented to: self, place, time, and situation - intermittent, has dementia Is this baseline? Yes   Triage Complete: Triage complete  Chief Complaint Acute hypoxemic respiratory failure (HCC) [J96.01]  Triage Note Patient presents to ed via [facility] states he was taken to his pcp for c/o sob and productive cough onset 2 days ago. Patient was tested for covid and flu in the office of which was neg. Was found to have sats of 84% RA was placed on [place] @6  liters increased to 91% upon ems arrival placed patient on NRB and increased sat to 97 % patient is alert oriented denies pain.    Allergies No Known Allergies  Level of Care/Admitting Diagnosis ED Disposition     ED Disposition  Admit   Condition  --   Comment  Hospital Area: [facility] [[id number]]  Level of Care: Progressive [102]  Admit to Progressive based on following criteria: RESPIRATORY PROBLEMS hypoxemic/hypercapnic respiratory failure that is responsive to NIPPV (BiPAP) or High Flow Nasal Cannula (6-80 lpm). Frequent assessment/intervention, no > Q2 hrs < Q4 hrs, to maintain oxygenation and pulmonary hygiene.  May admit patient to [facility] or [facility] if equivalent level of care is available:: No  Covid Evaluation: Symptomatic Person Under Investigation (PUI) or recent exposure (last 10 days) *Testing Required*  Diagnosis: Acute hypoxemic respiratory failure [facility]) [[id number]]  Admitting Physician: Maretta Bees [[id number]]  Attending Physician: Maretta Bees [[id number]]  Bed request comments: 5 west if ossiblet-on high flow oxygen-covid still pending  Certification:: I certify this patient will need inpatient services for at least 2 midnights  Expected Medical Readiness: 06/10/2023           B Medical/Surgery History Past Medical History:  Diagnosis Date   Accidental fall from ladder    resulting in multiple L rib fracturs, traumatic small left hemopneumothorax, left transverse process fracture at T4, T5, and T8 with four-day hospitalization coupled K. by mild from cytopenia and mild hyperglycemia   Arthritis    bilateral hands   Colon polyps    Dementia (HCC)    Diarrhea    IBS, Dr Kinnie Scales- suspect malabsorption/maldigestion due to lactose intolerance give the amount of milk products consumed   Diverticulitis    GERD (gastroesophageal reflux disease)    Gilbert's syndrome    Hiatal hernia    History of rib fracture 2015   fell from ladder   Hypertension    Left inguinal hernia 11/09/2018   Paralysis (HCC)    right facial paralysis   Past Surgical History:  Procedure Laterality Date   ADJACENT TISSUE TRANSFER/TISSUE REARRANGEMENT Right 07/08/2022   Procedure: ADJACENT TISSUE TRANSFER/LOCAL TISSUE REARRANGEMENT OF RIGHT FACE;  Surgeon: Janne Napoleon, MD;  Location: [facility] OR;  Service: Plastics;  Laterality: Right;  2 hours   CATARACT EXTRACTION, BILATERAL     CHOLECYSTECTOMY     COLONOSCOPY     esophagus stretch  02/13/2021   EYE SURGERY     HERNIA REPAIR     Right inguinal   INGUINAL HERNIA REPAIR Left 11/09/2018   Procedure: OPEN REPAIR LEFT INGUINAL HERNIA WITH MESH ERAS PATHWAY;  Surgeon: Claud Kelp, MD;  Location: [facility] OR;  Service: General;  Laterality: Left;  TAP BLOCK   TONSILLECTOMY  as a child   UPPER GASTROINTESTINAL ENDOSCOPY     UPPER GASTROINTESTINAL ENDOSCOPY  02/13/2021     A IV Location/Drains/Wounds Patient Lines/Drains/Airways Status     Active Line/Drains/Airways     Name Placement date Placement time Site Days   Peripheral IV 05/30/23 18 G Left Antecubital 05/30/23  1354  Antecubital  less than 1   Peripheral IV 05/30/23 20 G Anterior;Right Forearm 05/30/23  1415  Forearm  less than 1   Peripheral IV 05/30/23 20 G  Anterior;Right;Upper Arm 05/30/23  1440  Arm  less than 1   Incision (Closed) 11/09/18 Abdomen Other (Comment) 11/09/18  0838  -- 1663   Incision (Closed) 07/08/22 Face Other (Comment) 07/08/22  1038  -- 326            Intake/Output Last 24 hours  Intake/Output Summary (Last 24 hours) at 05/30/2023 1628 Last data filed at 05/30/2023 1515 Gross per 24 hour  Intake 1000 ml  Output --  Net 1000 ml    Labs/Imaging Results for orders placed or performed during the hospital encounter of 05/30/23 (from the past 48 hour(s))  Comprehensive metabolic panel     Status: Abnormal   Collection Time: 05/30/23  1:37 PM  Result Value Ref Range   Sodium 139 135 - 145 mmol/L   Potassium 4.2 3.5 - 5.1 mmol/L    Comment: HEMOLYSIS AT THIS LEVEL MAY AFFECT RESULT   Chloride 96 (L) 98 - 111 mmol/L   CO2 30 22 - 32 mmol/L   Glucose, Bld 123 (H) 70 - 99 mg/dL    Comment: Glucose reference range applies only to samples taken after fasting for at least 8 hours.   BUN 26 (H) 8 - 23 mg/dL   Creatinine, Ser 1.61 0.61 - 1.24 mg/dL   Calcium 9.3 8.9 - 09.6 mg/dL   Total Protein 6.0 (L) 6.5 - 8.1 g/dL   Albumin 3.3 (L) 3.5 - 5.0 g/dL   AST 30 15 - 41 U/L    Comment: HEMOLYSIS AT THIS LEVEL MAY AFFECT RESULT   ALT 10 0 - 44 U/L    Comment: HEMOLYSIS AT THIS LEVEL MAY AFFECT RESULT   Alkaline Phosphatase 63 38 - 126 U/L   Total Bilirubin 2.3 (H) 0.3 - 1.2 mg/dL    Comment: HEMOLYSIS AT THIS LEVEL MAY AFFECT RESULT   GFR, Estimated >60 >60 mL/min    Comment: (NOTE) Calculated using the CKD-EPI Creatinine Equation (2021)    Anion gap 13 5 - 15    Comment: Performed at [facility], [address] [place], [place] [address]  CBC with Differential     Status: Abnormal   Collection Time: 05/30/23  1:37 PM  Result Value Ref Range   WBC 10.2 4.0 - 10.5 K/uL   RBC 4.18 (L) 4.22 - 5.81 MIL/uL   Hemoglobin 12.6 (L) 13.0 - 17.0 g/dL   HCT 98.1 19.1 - 47.8 %   MCV 94.3 80.0 - 100.0 fL   MCH 30.1 26.0  - 34.0 pg   MCHC 32.0 30.0 - 36.0 g/dL   RDW 29.5 62.1 - 30.8 %   Platelets 118 (L) 150 - 400 K/uL   nRBC 0.0 0.0 - 0.2 %   Neutrophils Relative % 88 %   Neutro Abs 8.9 (H) 1.7 - 7.7 K/uL   Lymphocytes Relative 7 %   Lymphs Abs 0.7 0.7 - 4.0 K/uL   Monocytes Relative 5 %   Monocytes Absolute 0.5 0.1 - 1.0  K/uL   Eosinophils Relative 0 %   Eosinophils Absolute 0.0 0.0 - 0.5 K/uL   Basophils Relative 0 %   Basophils Absolute 0.0 0.0 - 0.1 K/uL   Immature Granulocytes 0 %   Abs Immature Granulocytes 0.03 0.00 - 0.07 K/uL    Comment: Performed at [facility], [address], [place], [place] [address]  Protime-INR     Status: Abnormal   Collection Time: 05/30/23  1:37 PM  Result Value Ref Range   Prothrombin Time 19.1 (H) 11.4 - 15.2 seconds   INR 1.6 (H) 0.8 - 1.2    Comment: (NOTE) INR goal varies based on device and disease states. Performed at [facility], [address], [place], [place] [address]   APTT     Status: None   Collection Time: 05/30/23  1:37 PM  Result Value Ref Range   aPTT 31 24 - 36 seconds    Comment: Performed at [facility], [address] [place], [place] [address]  I-Stat Lactic Acid, ED     Status: Abnormal   Collection Time: 05/30/23  2:52 PM  Result Value Ref Range   Lactic Acid, Venous 2.0 (HH) 0.5 - 1.9 mmol/L   DG Chest Port 1 View  Result Date: 05/30/2023 CLINICAL DATA:  Shortness of breath and cough for 2 days. EXAM: PORTABLE CHEST 1 VIEW COMPARISON:  06/09/2010 FINDINGS: Diminished exam detail secondary to rotational artifact. Stable cardiomediastinal contours. Unchanged asymmetric area of a shin of the left hemidiaphragm. Bilateral pulmonary opacities are noted within the left base, right mid and right lower lung. Remote healed left posterior rib fractures. IMPRESSION: Bilateral pulmonary opacities compatible with multifocal pneumonia. Electronically Signed   By: Signa Kell M.D.   On: 05/30/2023 15:05    Pending  Labs Unresulted Labs (From admission, onward)     Start     Ordered   05/30/23 1558  Brain natriuretic peptide  Once,   R        05/30/23 1558   05/30/23 1539  Respiratory (~20 pathogens) panel by PCR  (Respiratory panel by PCR (~20 pathogens, ~24 hr TAT)  w precautions)  Once,   URGENT        05/30/23 1538   05/30/23 1539  Procalcitonin  Once,   URGENT       References:    Procalcitonin Lower Respiratory Tract Infection AND Sepsis Procalcitonin Algorithm   05/30/23 1538   05/30/23 1539  C-reactive protein  Once,   URGENT        05/30/23 1538   05/30/23 1337  Resp panel by RT-PCR (RSV, Flu A&B, Covid) Anterior Nasal Swab  (Septic presentation on arrival (screening labs, nursing and treatment orders for obvious sepsis))  Once,   URGENT        05/30/23 1337   05/30/23 1337  Blood Culture (routine x 2)  (Septic presentation on arrival (screening labs, nursing and treatment orders for obvious sepsis))  BLOOD CULTURE X 2,   STAT      05/30/23 1337   05/30/23 1337  Urinalysis, w/ Reflex to Culture (Infection Suspected) -Urine, Clean Catch  (Septic presentation on arrival (screening labs, nursing and treatment orders for obvious sepsis))  ONCE - URGENT,   URGENT       Question:  Specimen Source  Answer:  Urine, Clean Catch   05/30/23 1337            Vitals/Pain Today's Vitals   05/30/23 1343 05/30/23  1347 05/30/23 1501 05/30/23 1518  Pulse:   80   Resp:   (!) 31   Temp: (!) 100.5 F (38.1 C)  (!) 100.8 F (38.2 C)   TempSrc: Rectal  Rectal   SpO2:   (!) 84%   Weight:  119 lb (54 kg)    Height:  5\' 10"  (1.778 m)    PainSc:  0-No pain  0-No pain    Isolation Precautions Droplet precaution  Medications Medications  lactated ringers infusion ( Intravenous New Bag/Given 05/30/23 1516)  azithromycin (ZITHROMAX) 500 mg in sodium chloride 0.9 % 250 mL IVPB (500 mg Intravenous New Bag/Given 05/30/23 1423)  lactated ringers bolus 1,000 mL (0 mLs Intravenous Stopped 05/30/23 1515)     Mobility walks     Focused Assessments Pulmonary Assessment Handoff:  Lung sounds: Bilateral Breath Sounds: Diminished O2 Device: High Flow Nasal Cannula O2 Flow Rate (L/min): 20 L/min    R Recommendations: See Admitting Provider Note  Report given to:   Additional Notes:  Family at bedside and super nice!!!

## 2023-05-30 NOTE — ED Notes (Signed)
Keep Pt on NRB at 15lpm per Dr. Andria Meuse until accurate SPO2 was read.

## 2023-05-30 NOTE — Sepsis Progress Note (Signed)
Sepsis protocol monitored by eLink ?

## 2023-05-30 NOTE — ED Provider Notes (Signed)
[place] EMERGENCY DEPARTMENT AT [facility] Provider Note   CSN: [id number] Arrival date & time: 05/30/23  1328     History {Add pertinent medical, surgical, social history, OB history to HPI:1} No chief complaint on file.   Brent Brennan is a 89 y.o. male.  HPI     Home Medications Prior to Admission medications   Medication Sig Start Date End Date Taking? Authorizing Provider  ARTIFICIAL TEAR SOLUTION OP Place 1 drop into both eyes 4 (four) times daily as needed (dry eyes).    [provider]  Ascorbic Acid (VITAMIN C) 1000 MG tablet Take 1,000 mg by mouth daily.    [provider]  aspirin EC 81 MG tablet Take 81 mg by mouth daily.    [provider]  calcium carbonate (OSCAL) 1500 (600 Ca) MG TABS tablet Take 600 mg of elemental calcium by mouth daily with breakfast.    [provider]  Cholecalciferol (VITAMIN D3) 50 MCG (2000 UT) capsule Take 2,000 Units by mouth daily.    [provider]  escitalopram (LEXAPRO) 10 MG tablet Take 10 mg by mouth daily. 12/18/20   [provider]  hydrochlorothiazide (HYDRODIURIL) 25 MG tablet Take 25 mg by mouth daily. 08/31/18   [provider]  memantine (NAMENDA) 10 MG tablet Take 10 mg by mouth 2 (two) times daily. 10/09/18   [provider]  mirtazapine (REMERON) 15 MG tablet Take 15 mg by mouth at bedtime. 10/15/20   [provider]  Multiple Vitamin (MULTIVITAMIN WITH MINERALS) TABS tablet Take 1 tablet by mouth daily.    [provider]  Multiple Vitamins-Minerals (PRESERVISION AREDS 2 PO) Take 1 tablet by mouth 2 (two) times daily.    [provider]  omeprazole (PRILOSEC) 20 MG capsule TAKE 1 CAPSULE(20 MG) BY MOUTH TWICE DAILY BEFORE A MEAL 11/03/22   Mansouraty, Netty Starring., MD  Potassium 99 MG TABS Take 99 mg by mouth daily.    [provider]  traMADol (ULTRAM) 50 MG tablet Take 1 tablet (50 mg total) by mouth  every 6 (six) hours as needed for severe pain (Only if needed). Patient taking differently: Take 50 mg by mouth daily. 11/09/18   Claud Kelp, MD      Allergies    Patient has no known allergies.    Review of Systems   Review of Systems  Physical Exam Updated Vital Signs There were no vitals taken for this visit. Physical Exam  ED Results / Procedures / Treatments   Labs (all labs ordered are listed, but only abnormal results are displayed) Labs Reviewed  RESP PANEL BY RT-PCR (RSV, FLU A&B, COVID)  RVPGX2  CULTURE, BLOOD (ROUTINE X 2)  CULTURE, BLOOD (ROUTINE X 2)  COMPREHENSIVE METABOLIC PANEL  CBC WITH DIFFERENTIAL/PLATELET  PROTIME-INR  APTT  URINALYSIS, W/ REFLEX TO CULTURE (INFECTION SUSPECTED)  I-STAT CG4 LACTIC ACID, ED    EKG None  Radiology No results found.  Procedures Procedures  {Document cardiac monitor, telemetry assessment procedure when appropriate:1}  Medications Ordered in ED Medications  lactated ringers infusion (has no administration in time range)  lactated ringers bolus 1,000 mL (has no administration in time range)  azithromycin (ZITHROMAX) 500 mg in sodium chloride 0.9 % 250 mL IVPB (has no administration in time range)    ED Course/ Medical Decision Making/ A&P   {   Click here for ABCD2, HEART and other calculatorsREFRESH Note before signing :1}  Medical Decision Making Amount and/or Complexity of Data Reviewed Labs: ordered. Radiology: ordered. ECG/medicine tests: ordered.  Risk Prescription drug management.   ***  {Document critical care time when appropriate:1} {Document review of labs and clinical decision tools ie heart score, Chads2Vasc2 etc:1}  {Document your independent review of radiology images, and any outside records:1} {Document your discussion with family members, caretakers, and with consultants:1} {Document social determinants of health affecting pt's care:1} {Document your  decision making why or why not admission, treatments were needed:1} Final Clinical Impression(s) / ED Diagnoses Final diagnoses:  None    Rx / DC Orders ED Discharge Orders     None

## 2023-05-30 NOTE — ED Provider Notes (Signed)
[place] EMERGENCY DEPARTMENT AT [facility] Provider Note   CSN: [id number] Arrival date & time: 05/30/23  1328     History  Chief Complaint  Patient presents with   Shortness of Breath    Brent Brennan is a 89 y.o. male.  This is a 89 year old male who presents emergency department today due to cough of few days duration.  Patient was seen by his primary care doctor, they were concerned for pneumonia, gave him ceftriaxone and sent him to the emergency department.   Shortness of Breath      Home Medications Prior to Admission medications   Medication Sig Start Date End Date Taking? Authorizing Provider  ARTIFICIAL TEAR SOLUTION OP Place 1 drop into both eyes 4 (four) times daily as needed (dry eyes).    [provider]  Ascorbic Acid (VITAMIN C) 1000 MG tablet Take 1,000 mg by mouth daily.    [provider]  aspirin EC 81 MG tablet Take 81 mg by mouth daily.    [provider]  calcium carbonate (OSCAL) 1500 (600 Ca) MG TABS tablet Take 600 mg of elemental calcium by mouth daily with breakfast.    [provider]  Cholecalciferol (VITAMIN D3) 50 MCG (2000 UT) capsule Take 2,000 Units by mouth daily.    [provider]  escitalopram (LEXAPRO) 10 MG tablet Take 10 mg by mouth daily. 12/18/20   [provider]  hydrochlorothiazide (HYDRODIURIL) 25 MG tablet Take 25 mg by mouth daily. 08/31/18   [provider]  memantine (NAMENDA) 10 MG tablet Take 10 mg by mouth 2 (two) times daily. 10/09/18   [provider]  mirtazapine (REMERON) 15 MG tablet Take 15 mg by mouth at bedtime. 10/15/20   [provider]  Multiple Vitamin (MULTIVITAMIN WITH MINERALS) TABS tablet Take 1 tablet by mouth daily.    [provider]  Multiple Vitamins-Minerals (PRESERVISION AREDS 2 PO) Take 1 tablet by mouth 2 (two) times daily.    [provider]  omeprazole (PRILOSEC) 20 MG capsule TAKE 1  CAPSULE(20 MG) BY MOUTH TWICE DAILY BEFORE A MEAL 11/03/22   Mansouraty, Netty Starring., MD  Potassium 99 MG TABS Take 99 mg by mouth daily.    [provider]  traMADol (ULTRAM) 50 MG tablet Take 1 tablet (50 mg total) by mouth every 6 (six) hours as needed for severe pain (Only if needed). Patient taking differently: Take 50 mg by mouth daily. 11/09/18   Claud Kelp, MD      Allergies    Patient has no known allergies.    Review of Systems   Review of Systems  Respiratory:  Positive for shortness of breath.     Physical Exam Updated Vital Signs Pulse 80   Temp (!) 100.8 F (38.2 C) (Rectal)   Resp (!) 31   Ht 5\' 10"  (1.778 m)   Wt 54 kg   SpO2 (!) 84%   BMI 17.07 kg/m  Physical Exam Vitals reviewed.  Cardiovascular:     Rate and Rhythm: Normal rate.  Pulmonary:     Comments: Diffuse rhonchi.  No wheezing.  Hypoxic. Abdominal:     Palpations: Abdomen is soft.  Neurological:     Mental Status: He is alert.     ED Results / Procedures / Treatments   Labs (all labs ordered are listed, but only abnormal results are displayed) Labs Reviewed  CBC WITH DIFFERENTIAL/PLATELET - Abnormal; Notable for the following components:  Result Value   RBC 4.18 (*)    Hemoglobin 12.6 (*)    Platelets 118 (*)    Neutro Abs 8.9 (*)    All other components within normal limits  PROTIME-INR - Abnormal; Notable for the following components:   Prothrombin Time 19.1 (*)    INR 1.6 (*)    All other components within normal limits  I-STAT CG4 LACTIC ACID, ED - Abnormal; Notable for the following components:   Lactic Acid, Venous 2.0 (*)    All other components within normal limits  RESP PANEL BY RT-PCR (RSV, FLU A&B, COVID)  RVPGX2  CULTURE, BLOOD (ROUTINE X 2)  CULTURE, BLOOD (ROUTINE X 2)  APTT  COMPREHENSIVE METABOLIC PANEL  URINALYSIS, W/ REFLEX TO CULTURE (INFECTION SUSPECTED)  I-STAT CG4 LACTIC ACID, ED    EKG None  Radiology DG Chest Port 1 View  Result  Date: 05/30/2023 CLINICAL DATA:  Shortness of breath and cough for 2 days. EXAM: PORTABLE CHEST 1 VIEW COMPARISON:  06/09/2010 FINDINGS: Diminished exam detail secondary to rotational artifact. Stable cardiomediastinal contours. Unchanged asymmetric area of a shin of the left hemidiaphragm. Bilateral pulmonary opacities are noted within the left base, right mid and right lower lung. Remote healed left posterior rib fractures. IMPRESSION: Bilateral pulmonary opacities compatible with multifocal pneumonia. Electronically Signed   By: Signa Kell M.D.   On: 05/30/2023 15:05    Procedures .Critical Care  Performed by: Arletha Pili, DO Authorized by: Arletha Pili, DO   Critical care provider statement:    Critical care time (minutes):  30   Critical care was necessary to treat or prevent imminent or life-threatening deterioration of the following conditions:  Respiratory failure   Critical care was time spent personally by me on the following activities:  Development of treatment plan with patient or surrogate, discussions with consultants, evaluation of patient's response to treatment, examination of patient, ordering and review of laboratory studies, ordering and review of radiographic studies, ordering and performing treatments and interventions, pulse oximetry, re-evaluation of patient's condition and review of old charts   Care discussed with: admitting provider       Medications Ordered in ED Medications  lactated ringers infusion ( Intravenous New Bag/Given 05/30/23 1516)  azithromycin (ZITHROMAX) 500 mg in sodium chloride 0.9 % 250 mL IVPB (500 mg Intravenous New Bag/Given 05/30/23 1423)  lactated ringers bolus 1,000 mL (0 mLs Intravenous Stopped 05/30/23 1515)    ED Course/ Medical Decision Making/ A&P                                 Medical Decision Making This is a 89 year old male who presents to the emergency department today for cough and fever.  Differential diagnoses  include pneumonia.  Plan -my independent review the patient's chest x-ray does show multifocal pneumonia.  I do have high concern for sepsis in this patient.  Sepsis protocol ordered.  Provided the patient with azithromycin on top of the Rocephin that he had already received.  Reassessment-patient continued to have episodes of desaturation.  Transition patient to high flow nasal cannula.  Will give the patient's labs, no leukocytosis, mild elevation in his lactic acid, does have elevated neutrophils.  Will admit patient to hospital team for pneumonia, acute hypoxic respiratory failure.  I reassessed the patient following his 30 cc/kg IV fluid bolus.  Amount and/or Complexity of Data Reviewed Labs: ordered. Radiology: ordered. ECG/medicine tests: ordered.  Risk Prescription drug management.           Final Clinical Impression(s) / ED Diagnoses Final diagnoses:  Hypoxia  Acute hypoxic respiratory failure [facility])    Rx / DC Orders ED Discharge Orders     None         Anders Simmonds T, DO 05/30/23 1529

## 2023-05-30 NOTE — ED Triage Notes (Signed)
Patient presents to ed via [facility] states he was taken to his pcp for c/o sob and productive cough onset 2 days ago. Patient was tested for covid and flu in the office of which was neg. Was found to have sats of 84% RA was placed on [place] @6  liters increased to 91% upon ems arrival placed patient on NRB and increased sat to 97 % patient is alert oriented denies pain.

## 2023-05-30 NOTE — ED Notes (Signed)
Pt needs a coude catheter.  Not trained in this skill.  Asked charge nurse Thayer Ohm) if there were any on duty in there ED.  She stated that there were not that she was aware of.

## 2023-05-30 NOTE — H&P (Signed)
History and Physical    Patient: Brent Brennan WGN:[id number] DOB: [birth year] DOA: 05/30/2023 DOS: the patient was seen and examined on 05/30/2023 PCP: Chilton Greathouse, MD  Patient coming from: Home  Chief Complaint:  Chief Complaint  Patient presents with   Shortness of Breath   HPI: Brent Brennan is a 89 y.o. male with medical history significant of HTN, dementia, depression, GERD who presented from PCPs office for shortness of breath  Per history obtained from patient/family-since this past Wednesday-patient has mostly developed a dry cough.  Family took him to his primary care practitioner's office this morning because he appeared more disoriented than usual.  Upon repeatedly questioning both family members/patient-he denies any shortness of breath even on exertion.  He denies any fever or chills.  There is no history of nausea, vomiting or diarrhea.  He denies any abdominal pain.  When he was evaluated at PCPs office-he was found to be hypoxic-chest x-ray done there showed pneumonia-he was apparently given a shot of Rocephin and sent to the ED for further evaluation and treatment.  When he first arrived in the ED-he apparently had O2 saturations in the low 80s on room air-and then was placed on nonrebreather mask.  Per my discussion with the ED nurse taking care of the patient-when the nurse tried to go down on the FiO2-used to desaturate down to the low 80s.  He was then started on heated high flow-in the hospitalist service was asked to admit this patient for further evaluation and treatment.  No headache No fever No chest pain Denies any shortness of breath No nausea or vomiting No diarrhea No abdominal pain No dysuria/hematuria   Review of Systems: As mentioned in the history of present illness. All other systems reviewed and are negative. Past Medical History:  Diagnosis Date   Accidental fall from ladder    resulting in multiple L rib fracturs, traumatic small  left hemopneumothorax, left transverse process fracture at T4, T5, and T8 with four-day hospitalization coupled K. by mild from cytopenia and mild hyperglycemia   Arthritis    bilateral hands   Colon polyps    Dementia (HCC)    Diarrhea    IBS, Dr Kinnie Scales- suspect malabsorption/maldigestion due to lactose intolerance give the amount of milk products consumed   Diverticulitis    GERD (gastroesophageal reflux disease)    Gilbert's syndrome    Hiatal hernia    History of rib fracture 2015   fell from ladder   Hypertension    Left inguinal hernia 11/09/2018   Paralysis (HCC)    right facial paralysis   Past Surgical History:  Procedure Laterality Date   ADJACENT TISSUE TRANSFER/TISSUE REARRANGEMENT Right 07/08/2022   Procedure: ADJACENT TISSUE TRANSFER/LOCAL TISSUE REARRANGEMENT OF RIGHT FACE;  Surgeon: Janne Napoleon, MD;  Location: [facility] OR;  Service: Plastics;  Laterality: Right;  2 hours   CATARACT EXTRACTION, BILATERAL     CHOLECYSTECTOMY     COLONOSCOPY     esophagus stretch  02/13/2021   EYE SURGERY     HERNIA REPAIR     Right inguinal   INGUINAL HERNIA REPAIR Left 11/09/2018   Procedure: OPEN REPAIR LEFT INGUINAL HERNIA WITH MESH ERAS PATHWAY;  Surgeon: Claud Kelp, MD;  Location: [facility] OR;  Service: General;  Laterality: Left;  TAP BLOCK   TONSILLECTOMY     as a child   UPPER GASTROINTESTINAL ENDOSCOPY     UPPER GASTROINTESTINAL ENDOSCOPY  02/13/2021   Social History:  reports that he quit  smoking about 51 years ago. His smoking use included cigarettes. He started smoking about 76 years ago. He has a 50 pack-year smoking history. He has never used smokeless tobacco. He reports that he does not currently use alcohol. He reports that he does not use drugs.  No Known Allergies  Family History  Problem Relation Age of Onset   Hypertension Mother    Diverticulitis Mother    Other Mother        DJD   Pneumonia Father    Alzheimer's disease Father    Cataracts Father     Colon cancer Neg Hx    Esophageal cancer Neg Hx    Pancreatic cancer Neg Hx    Stomach cancer Neg Hx    Heart disease Neg Hx    Diabetes Neg Hx    Inflammatory bowel disease Neg Hx    Liver disease Neg Hx    Rectal cancer Neg Hx    Migraines Neg Hx    Headache Neg Hx     Prior to Admission medications   Medication Sig Start Date End Date Taking? Authorizing Provider  ARTIFICIAL TEAR SOLUTION OP Place 1 drop into both eyes 4 (four) times daily as needed (dry eyes).    [provider]  Ascorbic Acid (VITAMIN C) 1000 MG tablet Take 1,000 mg by mouth daily.    [provider]  aspirin EC 81 MG tablet Take 81 mg by mouth daily.    [provider]  calcium carbonate (OSCAL) 1500 (600 Ca) MG TABS tablet Take 600 mg of elemental calcium by mouth daily with breakfast.    [provider]  Cholecalciferol (VITAMIN D3) 50 MCG (2000 UT) capsule Take 2,000 Units by mouth daily.    [provider]  escitalopram (LEXAPRO) 10 MG tablet Take 10 mg by mouth daily. 12/18/20   [provider]  hydrochlorothiazide (HYDRODIURIL) 25 MG tablet Take 25 mg by mouth daily. 08/31/18   [provider]  memantine (NAMENDA) 10 MG tablet Take 10 mg by mouth 2 (two) times daily. 10/09/18   [provider]  mirtazapine (REMERON) 15 MG tablet Take 15 mg by mouth at bedtime. 10/15/20   [provider]  Multiple Vitamin (MULTIVITAMIN WITH MINERALS) TABS tablet Take 1 tablet by mouth daily.    [provider]  Multiple Vitamins-Minerals (PRESERVISION AREDS 2 PO) Take 1 tablet by mouth 2 (two) times daily.    [provider]  omeprazole (PRILOSEC) 20 MG capsule TAKE 1 CAPSULE(20 MG) BY MOUTH TWICE DAILY BEFORE A MEAL 11/03/22   Mansouraty, Netty Starring., MD  Potassium 99 MG TABS Take 99 mg by mouth daily.    [provider]  traMADol (ULTRAM) 50 MG tablet Take 1 tablet (50 mg total) by mouth every 6 (six) hours as needed for  severe pain (Only if needed). Patient taking differently: Take 50 mg by mouth daily. 11/09/18   Claud Kelp, MD    Physical Exam: Vitals:   05/30/23 1343 05/30/23 1347 05/30/23 1501  Pulse:   80  Resp:   (!) 31  Temp: (!) 100.5 F (38.1 C)  (!) 100.8 F (38.2 C)  TempSrc: Rectal  Rectal  SpO2:   (!) 84%  Weight:  54 kg   Height:  5\' 10"  (1.778 m)    Gen Exam:Alert awake-not in any distress HEENT:atraumatic, normocephalic Chest: Rales bibasilar up to mid lung bilaterally. CVS:S1S2 regular Abdomen:soft non tender, non distended Extremities:+ edema Neurology: Non focal Skin: no rash  Data Reviewed:    Latest Ref Rng & Units 05/30/2023    1:37 PM 07/01/2022    9:23 AM 02/23/2021    4:28 PM  CBC  WBC 4.0 - 10.5 K/uL 10.2  8.6  8.2   Hemoglobin 13.0 - 17.0 g/dL 96.2  95.2  84.1   Hematocrit 39.0 - 52.0 % 39.4  43.3  43.9   Platelets 150 - 400 K/uL 118  165  205         Latest Ref Rng & Units 05/30/2023    1:37 PM 07/01/2022    9:23 AM 02/23/2021    4:28 PM  BMP  Glucose 70 - 99 mg/dL 324  401  027   BUN 8 - 23 mg/dL 26  17  11    Creatinine 0.61 - 1.24 mg/dL 2.53  6.64  4.03   Sodium 135 - 145 mmol/L 139  140  137   Potassium 3.5 - 5.1 mmol/L 4.2  3.8  3.7   Chloride 98 - 111 mmol/L 96  103  94   CO2 22 - 32 mmol/L 30  29  34   Calcium 8.9 - 10.3 mg/dL 9.3  9.3  9.6      Assessment and Plan: Acute hypoxic respiratory failure due to multifocal pneumonia Currently on heated high flow Unclear whether this is viral or bacterial-workup in progress-COVID PCR/respiratory virus panel/procalcitonin/CRP all pending Plan would be to continue Rocephin/Zithromax for now and await further workup He does have a little bit of leg edema bilaterally but does not appear to be grossly volume overloaded-do not think this is CHF at this point. He will be provided with other supportive care including antitussive medications Emphasized importance of incentive spirometry/flutter valve SLP  evaluation tomorrow morning given the severity of hypoxemia to make sure he does not have any silent aspiration PT/OT eval/mobilization by RN staff  HTN BP appears stable at this point-holding HCTZ for now IV hydralazine for SBP more than 170  Dementia Continue Namenda Delirium precautions-expect some amount of delirium during this hospitalization.  Although he is currently completely awake and alert.  Dementia appears to be mild.  Depression Lexapro  GERD  PPI  Palliative care Long discussion with patient-and daughter at bedside-all aware of severity of hypoxemia-potential for deterioration.  Patient is very elderly and may not do well with mechanical intubation/CPR if he were to worsen.  I have recommended a DNR order.  Patient/daughter to discuss further-they have another family member/daughter in [place] that they need to call and discuss further.  Full code for now.    Advance Care Planning:   Code Status: Full Code   Consults: None  Family Communication: Daughter at bedside  Severity of Illness: The appropriate patient status for this patient is INPATIENT. Inpatient status is judged to be reasonable and necessary in order to provide the required intensity of service to ensure the patient's safety. The patient's presenting symptoms, physical exam findings, and initial radiographic and laboratory data in the context of their chronic comorbidities is felt to place them at high risk for further clinical deterioration. Furthermore, it is not anticipated that the patient will be medically stable for discharge from the hospital within 2 midnights of admission.   * I certify that at the point of admission it is my clinical judgment that the patient will require inpatient hospital care spanning beyond 2 midnights from the point of admission due to high intensity of service, high risk for further deterioration and high frequency of  surveillance required.*  Author: Jeoffrey Massed,  MD 05/30/2023 4:45 PM  For on call review [email or web address].

## 2023-05-30 NOTE — ED Notes (Signed)
Attempted to lower O2 to 10lpm via NRB.  Pt's O2 saturation dropped to the 80s.

## 2023-05-31 ENCOUNTER — Other Ambulatory Visit (HOSPITAL_COMMUNITY): Payer: Medicare Other

## 2023-05-31 ENCOUNTER — Inpatient Hospital Stay (HOSPITAL_COMMUNITY): Payer: Medicare Other

## 2023-05-31 DIAGNOSIS — K219 Gastro-esophageal reflux disease without esophagitis: Secondary | ICD-10-CM | POA: Diagnosis not present

## 2023-05-31 DIAGNOSIS — I159 Secondary hypertension, unspecified: Secondary | ICD-10-CM | POA: Diagnosis not present

## 2023-05-31 DIAGNOSIS — J9601 Acute respiratory failure with hypoxia: Secondary | ICD-10-CM | POA: Diagnosis not present

## 2023-05-31 DIAGNOSIS — F0390 Unspecified dementia without behavioral disturbance: Secondary | ICD-10-CM | POA: Diagnosis not present

## 2023-05-31 LAB — COMPREHENSIVE METABOLIC PANEL
ALT: 13 U/L (ref 0–44)
AST: 17 U/L (ref 15–41)
Albumin: 2.4 g/dL — ABNORMAL LOW (ref 3.5–5.0)
Alkaline Phosphatase: 46 U/L (ref 38–126)
Anion gap: 9 (ref 5–15)
BUN: 20 mg/dL (ref 8–23)
CO2: 32 mmol/L (ref 22–32)
Calcium: 8.5 mg/dL — ABNORMAL LOW (ref 8.9–10.3)
Chloride: 98 mmol/L (ref 98–111)
Creatinine, Ser: 0.83 mg/dL (ref 0.61–1.24)
GFR, Estimated: >60 mL/min (ref >60)
Glucose, Bld: 116 mg/dL — ABNORMAL HIGH (ref 70–99)
Potassium: 3.2 mmol/L — ABNORMAL LOW (ref 3.5–5.1)
Sodium: 139 mmol/L (ref 135–145)
Total Bilirubin: 1.1 mg/dL (ref 0.3–1.2)
Total Protein: 4.7 g/dL — ABNORMAL LOW (ref 6.5–8.1)

## 2023-05-31 LAB — BRAIN NATRIURETIC PEPTIDE: B Natriuretic Peptide: 299.9 pg/mL — ABNORMAL HIGH (ref 0.0–100.0)

## 2023-05-31 LAB — URINALYSIS, W/ REFLEX TO CULTURE (INFECTION SUSPECTED)
Bacteria, UA: NONE SEEN
Bilirubin Urine: NEGATIVE
Glucose, UA: NEGATIVE mg/dL
Hgb urine dipstick: NEGATIVE
Ketones, ur: 20 mg/dL — AB
Leukocytes,Ua: NEGATIVE
Nitrite: NEGATIVE
Protein, ur: 30 mg/dL — AB
Specific Gravity, Urine: 1.027 (ref 1.005–1.030)
pH: 5 (ref 5.0–8.0)

## 2023-05-31 LAB — CBC
HCT: 32.1 % — ABNORMAL LOW (ref 39.0–52.0)
Hemoglobin: 10.4 g/dL — ABNORMAL LOW (ref 13.0–17.0)
MCH: 29.9 pg (ref 26.0–34.0)
MCHC: 32.4 g/dL (ref 30.0–36.0)
MCV: 92.2 fL (ref 80.0–100.0)
Platelets: 76 10*3/uL — ABNORMAL LOW (ref 150–400)
RBC: 3.48 MIL/uL — ABNORMAL LOW (ref 4.22–5.81)
RDW: 13.2 % (ref 11.5–15.5)
WBC: 5.9 10*3/uL (ref 4.0–10.5)
nRBC: 0 % (ref 0.0–0.2)

## 2023-05-31 LAB — PROCALCITONIN: Procalcitonin: 0.4 ng/mL

## 2023-05-31 LAB — STREP PNEUMONIAE URINARY ANTIGEN: Strep Pneumo Urinary Antigen: NEGATIVE

## 2023-05-31 LAB — C-REACTIVE PROTEIN: CRP: 18.2 mg/dL — ABNORMAL HIGH (ref <1.0)

## 2023-05-31 LAB — D-DIMER, QUANTITATIVE: D-Dimer, Quant: 0.92 ug{FEU}/mL — ABNORMAL HIGH (ref 0.00–0.50)

## 2023-05-31 LAB — SEDIMENTATION RATE: Sed Rate: 45 mm/hr — ABNORMAL HIGH (ref 0–16)

## 2023-05-31 MED ORDER — SODIUM CHLORIDE 0.9 % IV SOLN
3.0000 g | Freq: Three times a day (TID) | INTRAVENOUS | Status: DC
  Administered 2023-05-31 – 2023-06-04 (×12): 3 g via INTRAVENOUS
  Filled 2023-05-31 (×12): qty 8

## 2023-05-31 MED ORDER — DEXTROSE IN LACTATED RINGERS 5 % IV SOLN: INTRAVENOUS | Status: DC

## 2023-05-31 MED ORDER — PANTOPRAZOLE SODIUM 40 MG PO TBEC
40.0000 mg | DELAYED_RELEASE_TABLET | Freq: Two times a day (BID) | ORAL | Status: DC
  Filled 2023-05-31: qty 1

## 2023-05-31 MED ORDER — CALCIUM CARBONATE 1250 (500 CA) MG PO TABS
1.0000 | ORAL_TABLET | Freq: Every day | ORAL | Status: DC
  Filled 2023-05-31 (×4): qty 1

## 2023-05-31 MED ORDER — POTASSIUM CHLORIDE CRYS ER 20 MEQ PO TBCR
40.0000 meq | EXTENDED_RELEASE_TABLET | Freq: Once | ORAL | Status: AC
  Administered 2023-05-31: 40 meq via ORAL
  Filled 2023-05-31: qty 2

## 2023-05-31 MED ORDER — FUROSEMIDE 10 MG/ML IJ SOLN
40.0000 mg | Freq: Once | INTRAMUSCULAR | Status: AC
  Administered 2023-05-31: 40 mg via INTRAVENOUS
  Filled 2023-05-31: qty 4

## 2023-05-31 NOTE — Consult Note (Signed)
NAME:  Brent Brennan, MRN:  [id number], DOB:  01/03/1931, LOS: 1 ADMISSION DATE:  05/30/2023, CONSULTATION DATE:  817/24 REFERRING MD:  Dr Nicholas Lose, CHIEF COMPLAINT:  Acute Hypoxemic Respiratory Failure    History of Present Illness:   89 year old male who quit smoking many decades ago.  He worked at [employer].  History is given by review of the medical records and also by the daughter.  It appears the last few years she has had intermittent mild dysphagia not otherwise specified..  He has a history of esophageal dilatation details of which are not fully known.  He also chart report of dementia not otherwise specified, diverticulitis, acid reflux, Gilbert's syndrome, hiatal hernia he suffered Bell's palsy on 02/28/2019 is left with severe residual defect but even before that he said the dysphagia and this dysphagia has persisted intermittently and mildly since then without change.  In November 2022 he also suffers from stage III pressure ulcer of the right buttock.  He has lost 5-10 pounds in the last 1 year with some failure to thrive.  Although his appetite itself is reported to be good.  He also has history of chronic elevation of the left diaphragm which the daughter reports as "hernia".  Most recently on Wednesday, May 28, 2023 he had abrupt issue of dysphagia while swallowing a pill and since then started having acute cough and deteriorated on 05/30/2023 had more disorientation and went to primary care office.  Found to be hypoxemic pulse ox 80% on room air and admitted to the hospital.  Denied any fever or chest pain or vomiting or diarrhea abdominal pain.  On 05/31/2023: Pulmonary medicine consulted.  He failed swallow evaluation by bedside speech-language pathologist.  At the time of [facility] consultation was requiring 15 L high flow nasal cannula.  Sitting on the chair.  Patient admitted as full code but made DNR in the morning of 05/31/2023.  Past Medical History:    has a past  medical history of Accidental fall from ladder, Arthritis, Colon polyps, Dementia (HCC), Diarrhea, Diverticulitis, GERD (gastroesophageal reflux disease), Gilbert's syndrome, Hiatal hernia, History of rib fracture (2015), Hypertension, Left inguinal hernia (11/09/2018), and Paralysis (HCC).   reports that he quit smoking about 51 years ago. His smoking use included cigarettes. He started smoking about 76 years ago. He has a 50 pack-year smoking history. He has never used smokeless tobacco.  Past Surgical History:  Procedure Laterality Date   ADJACENT TISSUE TRANSFER/TISSUE REARRANGEMENT Right 07/08/2022   Procedure: ADJACENT TISSUE TRANSFER/LOCAL TISSUE REARRANGEMENT OF RIGHT FACE;  Surgeon: Janne Napoleon, MD;  Location: [facility] OR;  Service: Plastics;  Laterality: Right;  2 hours   CATARACT EXTRACTION, BILATERAL     CHOLECYSTECTOMY     COLONOSCOPY     esophagus stretch  02/13/2021   EYE SURGERY     HERNIA REPAIR     Right inguinal   INGUINAL HERNIA REPAIR Left 11/09/2018   Procedure: OPEN REPAIR LEFT INGUINAL HERNIA WITH MESH ERAS PATHWAY;  Surgeon: Claud Kelp, MD;  Location: [facility] OR;  Service: General;  Laterality: Left;  TAP BLOCK   TONSILLECTOMY     as a child   UPPER GASTROINTESTINAL ENDOSCOPY     UPPER GASTROINTESTINAL ENDOSCOPY  02/13/2021    No Known Allergies  Immunization History  Administered Date(s) Administered   PFIZER(Purple Top)SARS-COV-2 Vaccination 10/28/2019, 11/18/2019    Family History  Problem Relation Age of Onset   Hypertension Mother    Diverticulitis Mother    Other  Mother        DJD   Pneumonia Father    Alzheimer's disease Father    Cataracts Father    Colon cancer Neg Hx    Esophageal cancer Neg Hx    Pancreatic cancer Neg Hx    Stomach cancer Neg Hx    Heart disease Neg Hx    Diabetes Neg Hx    Inflammatory bowel disease Neg Hx    Liver disease Neg Hx    Rectal cancer Neg Hx    Migraines Neg Hx    Headache Neg Hx      Current  Facility-Administered Medications:    acetaminophen (TYLENOL) tablet 650 mg, 650 mg, Oral, Q6H PRN **OR** acetaminophen (TYLENOL) suppository 650 mg, 650 mg, Rectal, Q6H PRN, Ghimire, Werner Lean, MD   ascorbic acid (VITAMIN C) tablet 1,000 mg, 1,000 mg, Oral, Daily, Ghimire, Werner Lean, MD   azithromycin (ZITHROMAX) 500 mg in sodium chloride 0.9 % 250 mL IVPB, 500 mg, Intravenous, Q24H, Ghimire, Werner Lean, MD, Last Rate: 250 mL/hr at 05/30/23 1423, 500 mg at 05/30/23 1423   [START ON 06/01/2023] calcium carbonate (OS-CAL - dosed in mg of elemental calcium) tablet 1,250 mg, 1 tablet, Oral, Q breakfast, Ghimire, Shanker M, MD   cefTRIAXone (ROCEPHIN) 2 g in sodium chloride 0.9 % 100 mL IVPB, 2 g, Intravenous, Q24H, Ghimire, Shanker M, MD   enoxaparin (LOVENOX) injection 40 mg, 40 mg, Subcutaneous, Q24H, Ghimire, Shanker M, MD   escitalopram (LEXAPRO) tablet 10 mg, 10 mg, Oral, Daily, Ghimire, Shanker M, MD   furosemide (LASIX) injection 40 mg, 40 mg, Intravenous, Once, Ghimire, Werner Lean, MD   guaiFENesin (MUCINEX) 12 hr tablet 600 mg, 600 mg, Oral, BID, Ghimire, Shanker M, MD   hydrALAZINE (APRESOLINE) injection 10 mg, 10 mg, Intravenous, Q6H PRN, Ghimire, Werner Lean, MD   levalbuterol (XOPENEX) nebulizer solution 0.63 mg, 0.63 mg, Nebulization, TID, Ghimire, Shanker M, MD, 0.63 mg at 05/31/23 0810   melatonin tablet 5 mg, 5 mg, Oral, QHS PRN, Ghimire, Werner Lean, MD   memantine (NAMENDA) tablet 10 mg, 10 mg, Oral, BID, Ghimire, Werner Lean, MD   mirtazapine (REMERON) tablet 15 mg, 15 mg, Oral, QHS, Ghimire, Shanker M, MD   multivitamin with minerals tablet 1 tablet, 1 tablet, Oral, Daily, Ghimire, Shanker M, MD   ondansetron (ZOFRAN) tablet 4 mg, 4 mg, Oral, Q6H PRN **OR** ondansetron (ZOFRAN) injection 4 mg, 4 mg, Intravenous, Q6H PRN, Ghimire, Shanker M, MD   pantoprazole (PROTONIX) EC tablet 40 mg, 40 mg, Oral, Daily, Ghimire, Shanker M, MD   polyethylene glycol (MIRALAX / GLYCOLAX) packet 17 g, 17 g,  Oral, Daily PRN, Ghimire, Shanker M, MD   potassium chloride SA (KLOR-CON M) CR tablet 40 mEq, 40 mEq, Oral, Once, Ghimire, Werner Lean, MD   traMADol (ULTRAM) tablet 50 mg, 50 mg, Oral, Q6H PRN, Ghimire, Werner Lean, MD   Vitamin D3 2,000 Units, 2,000 Units, Oral, Daily, Ghimire, Werner Lean, MD     Significant Hospital Events:  05/30/2023 - admit 05/31/23 pulm consult   Interim History / Subjective:   05/31/2023 - seen for pulm consult. Bed 5w28 [facility]  Objective   Blood pressure (!) 116/52, pulse 74, temperature 98.5 F (36.9 C), temperature source Oral, resp. rate (!) 26, height 5\' 10"  (1.778 m), weight 54 kg, SpO2 93%.    FiO2 (%):  [38 %-50 %] 50 %   Intake/Output Summary (Last 24 hours) at 05/31/2023 0931 Last data filed at 05/31/2023 0700  Gross per 24 hour  Intake 1000 ml  Output 150 ml  Net 850 ml   Filed Weights   05/30/23 1347  Weight: 54 kg    Examination: General: Thin frail male sitting in the chair HENT: Oxygen on.  Very severe residual Bell's palsy Lungs: Emaciated chest.  Not tachypneic.  Mild crackles but overall no respiratory distress Cardiovascular: Regular rate and rhythm.  Normal heart sounds Abdomen: Soft nontender Extremities: No cyanosis no clubbing no edema Neuro: Alert.  Orientation not checked.  He did follow some simple commands GU: Not examined  Resolved Hospital Problem list   X  Assessment & Plan:    Former smoker Chronic elevation of the left diaphragm History of left-sided rib fractures in 2011 with residual small hydropneumothorax in 2011  Currently - Acute hypoxemic respiratory failure with pulmonary infiltrates present at admission following choking episode.  Low probability for PE despite D-dimer 0.9  05/31/2023 -> on high flow nasal cannula  P:   Agree with Unasyn Aspiration precautions Support DNR/DNI [89 years old plus frail plus failure to thrive plus chronic dysphagia]  High-resolution CT chest [evaluate for  hiatal hernia and also elevation of left diaphragm and pulmonary infiltrates]  Check urine strep and urine Legionella Check ESR and procalcitonin Check echocardiogram  Consider steroids if Boop is a possibility based on course with antibiotics and also lab data  Best practice (daily eval):  According to the hospitalist   Family Updates: Daughter updated at the bedside    SIGNATURE    Dr. Kalman Shan, M.D., F.C.C.P,  Pulmonary and Critical Care Medicine Staff Physician, [facility] Center Director - Interstitial Lung Disease  Program  Pulmonary Fibrosis [facility] [place], [place], [address]  NPI Number:  NPI #[id number]  Pager: [phone], If no answer  -> Check AMION or Try [phone] Telephone (clinical office): [phone] Telephone (research): [phone]  9:31 AM 05/31/2023   05/31/2023 9:31 AM    LABS    PULMONARY No results for input(s): "PHART", "PCO2ART", "PO2ART", "HCO3", "TCO2", "O2SAT" in the last 168 hours.  Invalid input(s): "PCO2", "PO2"  CBC Recent Labs  Lab 05/30/23 1337 05/30/23 1655 05/31/23 0233  HGB 12.6* 10.8* 10.4*  HCT 39.4 33.9* 32.1*  WBC 10.2 7.8 5.9  PLT 118* 95* 76*    COAGULATION Recent Labs  Lab 05/30/23 1337  INR 1.6*    CARDIAC  No results for input(s): "TROPONINI" in the last 168 hours. No results for input(s): "PROBNP" in the last 168 hours.  CHEMISTRY Recent Labs  Lab 05/30/23 1337 05/30/23 1655 05/31/23 0233  NA 139  --  139  K 4.2  --  3.2*  CL 96*  --  98  CO2 30  --  32  GLUCOSE 123*  --  116*  BUN 26*  --  20  CREATININE 1.00 0.91 0.83  CALCIUM 9.3  --  8.5*   Estimated Creatinine Clearance: 45.2 mL/min (by C-G formula based on SCr of 0.83 mg/dL).   LIVER Recent Labs  Lab 05/30/23 1337 05/31/23 0233  AST 30 17  ALT 10 13  ALKPHOS 63 46  BILITOT 2.3* 1.1  PROT 6.0* 4.7*  ALBUMIN 3.3* 2.4*  INR 1.6*  --       INFECTIOUS Recent Labs  Lab 05/30/23 1452 05/30/23 1655 05/30/23 1736  LATICACIDVEN 2.0*  --  1.2  PROCALCITON  --  0.54  --  ENDOCRINE CBG (last 3)  No results for input(s): "GLUCAP" in the last 72 hours.       IMAGING x48h  - image(s) personally visualized  -   highlighted in bold DG Chest Port 1 View  Result Date: 05/30/2023 CLINICAL DATA:  Shortness of breath and cough for 2 days. EXAM: PORTABLE CHEST 1 VIEW COMPARISON:  06/09/2010 FINDINGS: Diminished exam detail secondary to rotational artifact. Stable cardiomediastinal contours. Unchanged asymmetric area of a shin of the left hemidiaphragm. Bilateral pulmonary opacities are noted within the left base, right mid and right lower lung. Remote healed left posterior rib fractures. IMPRESSION: Bilateral pulmonary opacities compatible with multifocal pneumonia. Electronically Signed   By: Signa Kell M.D.   On: 05/30/2023 15:05

## 2023-05-31 NOTE — Evaluation (Signed)
Occupational Therapy Evaluation Patient Details Name: Brent Brennan MRN: [id number] DOB: [birth year] Today's Date: 05/31/2023   History of Present Illness 89 yo male presenting to ED 8/16 with increased disorientation and a dry cough. +pna, acute respiratory failure  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   Clinical Impression   At baseline, pt completes ADLs and functional mobility Independent to Mod I. At baseline, pt receives assistance from his daughter for all IADLs including medication management. Pt now presents with decreased activity tolerance and decreased safety and independence with ADLs and functional transfers/mobility. Pt currently demonstrates ability to complete UB ADLs with Mod I to Contact guard assist, LB ADLs with Contact guard to Min assist, and functional transfers with Contact guard assist with +1 hand held assist. Pt currently limited by cardiopulmonary status with pt O2 sat dropping into upper 70s-low 80s on 15L O2 through HFNC with activities in standing with O2 sat recovering quickly to >92% with seated rest break. Pt will benefit from acute skilled OT services to address deficits outlined below and increase safety and independence with ADLs, functional transfers, and functional mobility. Post acute discharge, pt will benefit from continued skilled OT services in the home to maximize rehab potential with OT recommending 24 hour supervision from family due to pt's current cardiopulmonary status paired with pt's baseline cognitive deficits.       If plan is discharge home, recommend the following: A little help with walking and/or transfers;A little help with bathing/dressing/bathroom;Assistance with cooking/housework;Direct supervision/assist for medications management;Direct supervision/assist for financial management;Assist for transportation;Help with stairs or ramp for entrance    Functional Status Assessment  Patient has had  a recent decline in their functional status and demonstrates the ability to make significant improvements in function in a reasonable and predictable amount of time.  Equipment Recommendations  None recommended by OT (Pt already has all recommended equipment)    Recommendations for Other Services       Precautions / Restrictions Precautions Precautions: Fall;Other (comment) Precaution Comments: monitor sats closely Restrictions Weight Bearing Restrictions: No      Mobility Bed Mobility Overal bed mobility: Needs Assistance Bed Mobility: Supine to Sit     Supine to sit: Supervision, HOB elevated, Used rails (occasional verbal cues needed for hand placement/technique)          Transfers Overall transfer level: Needs assistance Equipment used: 1 person hand held assist Transfers: Sit to/from Stand, Bed to chair/wheelchair/BSC Sit to Stand: Contact guard assist     Step pivot transfers: Contact guard assist            Balance Overall balance assessment: Mild deficits observed, not formally tested                                         ADL either performed or assessed with clinical judgement   ADL Overall ADL's : Needs assistance/impaired Eating/Feeding: Modified independent;Sitting   Grooming: Set up;Sitting   Upper Body Bathing: Contact guard assist;Sitting   Lower Body Bathing: Minimal assistance;Sit to/from stand   Upper Body Dressing : Contact guard assist Upper Body Dressing Details (indicate cue type and reason): assistance with managing telematry cords and O2 tubing Lower Body Dressing: Contact guard assist   Toilet Transfer: Contact guard assist;BSC/3in1 (step pivot, +1 hand held assist)   Toileting- Clothing Manipulation and Hygiene: Minimal assistance;Sit to/from stand  General ADL Comments: Pt with decreased activity tolerance and with O2 sat dropping into upper 70s-low 80s on 15L O2 through HFNC with activities in  standing with O2 sat recovering quickly to >92% with seated rest.     Vision Baseline Vision/History: 4 Cataracts;0 No visual deficits;1 Wears glasses (Hx B cataracts with subsequent surgery; No defiicits on Left, decreased vision on R due to Bell's Palsy) Ability to See in Adequate Light: 1 Impaired Patient Visual Report: No change from baseline       Perception         Praxis         Pertinent Vitals/Pain Pain Assessment Pain Assessment: No/denies pain     Extremity/Trunk Assessment Upper Extremity Assessment Upper Extremity Assessment: Right hand dominant;Overall [facility] for tasks assessed   Lower Extremity Assessment Lower Extremity Assessment: Defer to PT evaluation   Cervical / Trunk Assessment Cervical / Trunk Assessment: Normal   Communication Communication Communication: Hearing impairment Cueing Techniques: Visual cues   Cognition Arousal: Alert Behavior During Therapy: WFL for tasks assessed/performed Overall Cognitive Status: History of cognitive impairments - at baseline                                 General Comments: Pt AAOx4 and pleasant throughout session. Pt requiring increased time for processing and occasional cues for sequencing tasks. Pt demonstrates ability to follow 1-step commands consitently. Pt with impaired memory, impaired problem solving, and difficulty learning novel tasks at baseline.     General Comments  Pt O2 sat dropping into upper 70s-low 80s on 15L O2 through HFNC with activities in standing with O2 sat recovering quickly to >92% with seated rest. All other VSS throughout session. Pt's daughter and family friend present through majority of session.    Exercises     Shoulder Instructions      Home Living Family/patient expects to be discharged to:: Private residence Living Arrangements: Children Available Help at Discharge: Family;Available 24 hours/day Type of Home: House Home Access: Level entry     Home  Layout: One level     Bathroom Shower/Tub: Engineer, production Accessibility: Yes How Accessible: Accessible via walker Home Equipment: Tub bench;BSC/3in1;Rolling Walker (2 wheels);Rollator (4 wheels);Wheelchair - manual;Hand held shower head ([place])          Prior Functioning/Environment Prior Level of Function : Independent/Modified Independent             Mobility Comments: Independent without an AD ADLs Comments: Independent with ADLs, assist with IADLs, including medication management        OT Problem List: Decreased activity tolerance;Cardiopulmonary status limiting activity      OT Treatment/Interventions: Self-care/ADL training;Therapeutic exercise;Energy conservation;Therapeutic activities;DME and/or AE instruction;Balance training;Patient/family education    OT Goals(Current goals can be found in the care plan section) Acute Rehab OT Goals Patient Stated Goal: To retun home OT Goal Formulation: With patient/family Time For Goal Achievement: 06/14/23 Potential to Achieve Goals: Good ADL Goals Pt Will Perform Grooming: standing;with modified independence Pt Will Perform Lower Body Bathing: with modified independence;sit to/from stand Pt Will Perform Lower Body Dressing: with modified independence;sit to/from stand Pt Will Transfer to Toilet: with modified independence;ambulating;regular height toilet;grab bars Pt Will Perform Toileting - Clothing Manipulation and hygiene: with modified independence;sit to/from stand Pt/caregiver will Perform Home Exercise Program: Both right and left upper extremity;With theraband;With Supervision;With written HEP provided (Increased activity tolerance)  OT Frequency: Min  1X/week    Co-evaluation              AM-PAC OT "6 Clicks" Daily Activity     Outcome Measure Help from another person eating meals?: None Help from another person taking care of personal grooming?: A Little Help from another person  toileting, which includes using toliet, bedpan, or urinal?: A Little Help from another person bathing (including washing, rinsing, drying)?: A Little Help from another person to put on and taking off regular upper body clothing?: A Little Help from another person to put on and taking off regular lower body clothing?: A Little 6 Click Score: 19   End of Session Equipment Utilized During Treatment: Gait belt;Oxygen Nurse Communication: Mobility status;Other (comment) (O2 sat dropping with activities in standing on 15L O2 through HFNC)  Activity Tolerance: Treatment limited secondary to medical complications (Comment) (Limited by O2 sat) Patient left: in chair;with call bell/phone within reach;with chair alarm set;with family/visitor present  OT Visit Diagnosis: Other (comment) (Decreased activity tolerance)                Time: 6295-2841 OT Time Calculation (min): 33 min Charges:  OT General Charges $OT Visit: 1 Visit OT Evaluation $OT Eval Low Complexity: 1 Low OT Treatments $Self Care/Home Management : 8-22 mins  Nira Visscher "Orson Eva., OTR/L, MA Acute Rehab [phone]   Lendon Colonel 05/31/2023, 5:42 PM

## 2023-05-31 NOTE — Consult Note (Signed)
Pharmacy Antibiotic Note  Brent Brennan is a 89 y.o. male admitted on 05/30/2023 with pneumonia.  Pharmacy has been consulted for Unasyn  dosing.  Patient t presented with cough, fever and acute hypoxic respiratory failure in the setting of multifactorial pneumonia. Patient Scr 0.83, WBC 5.9 and afebrile.   Plan: Unasyn 3g IV q8h.  Height: 5\' 10"  (177.8 cm) Weight: 54 kg (119 lb) IBW/kg (Calculated) : 73  Temp (24hrs), Avg:97.9 F (36.6 C), Min:97.6 F (36.4 C), Max:98.5 F (36.9 C)  Recent Labs  Lab 05/30/23 1337 05/30/23 1452 05/30/23 1655 05/30/23 1736 05/31/23 0233  WBC 10.2  --  7.8  --  5.9  CREATININE 1.00  --  0.91  --  0.83  LATICACIDVEN  --  2.0*  --  1.2  --     Estimated Creatinine Clearance: 45.2 mL/min (by C-G formula based on SCr of 0.83 mg/dL).    No Known Allergies  Antimicrobials this admission: 8/16 Zithromax >> 8/17  8/17 Unasyn >>   Dose adjustments this admission: N/a  Microbiology results: 8/16 Bcx  >> ngtd 8/16 RT- PCC>> neg   Thank you for allowing pharmacy to be a part of this patient's care.  Merla Riches, PharmD 05/31/2023 4:05 PM

## 2023-05-31 NOTE — Progress Notes (Addendum)
PROGRESS NOTE        PATIENT DETAILS Name: Brent Brennan Age: 89 y.o. Sex: male Date of Birth: 1931-06-05 Admit Date: 05/30/2023 Admitting Physician Brent Brennan Brent Dredge, MD RUE:AVWU, Ravisankar, MD  Brief Summary: Patient is a 89 y.o.  male prior history of Bell's palsy, HTN, dementia, depression, GERD-who apparently had a "bout" of PNA in June-and was treated as an outpatient with oral antimicrobial therapy-presented with cough-upon further evaluation-he was found to have fever and acute hypoxic respiratory failure in the setting of multifocal pneumonia.  Patient was started on heated high flow oxygen in the ED and subsequently admitted to the hospitalist service.  Significant events: 8/16>> admit to [facility]-hypoxia-heated high flow  Significant studies: 8/16>> CXR: Bilateral pulmonary opacities  Significant microbiology data: 8/16>> COVID/influenza/RSV PCR: Negative 8/16>> respiratory virus panel: Negative 8/16>> blood cultures: Negative  Procedures: None  Consults: PCCM  Subjective: Lying comfortably in bed-denies any chest pain or shortness of breath.  On heated high flow.  Cough better.  Objective: Vitals: Blood pressure (!) 107/58, pulse 81, temperature 97.8 F (36.6 C), temperature source Oral, resp. rate 18, height 5\' 10"  (1.778 m), weight 54 kg, SpO2 95%.   Exam: Gen Exam:Alert awake-not in any distress HEENT:atraumatic, normocephalic Chest: B/L clear to auscultation anteriorly CVS:S1S2 regular Abdomen:soft non tender, non distended Extremities:no edema Neurology: Non focal.  Right-sided facial weakness (chronic) Skin: no rash  Pertinent Labs/Radiology:    Latest Ref Rng & Units 05/31/2023    2:33 AM 05/30/2023    4:55 PM 05/30/2023    1:37 PM  CBC  WBC 4.0 - 10.5 K/uL 5.9  7.8  10.2   Hemoglobin 13.0 - 17.0 g/dL 98.1  19.1  47.8   Hematocrit 39.0 - 52.0 % 32.1  33.9  39.4   Platelets 150 - 400 K/uL 76  95  118     Lab Results   Component Value Date   NA 139 05/31/2023   K 3.2 (L) 05/31/2023   CL 98 05/31/2023   CO2 32 05/31/2023      Assessment/Plan: Acute hypoxic respiratory failure secondary to multifocal pneumonia Appears very comfortable-discussed with RT-to see if we can switch him to salter high flow Unclear etiology-extensive viral panel studies negative-procalcitonin negative-however CRP is elevated-unclear whether this is organizing pneumonia from recent bout of PNA or this is viral pneumonia. Plan at this point is to continue IV antibiotics-for now until cultures are final given the severity of his illness Although not grossly volume overloaded-will start diuretics and try and keep him in negative balance Suspect he will require some steroids-given significantly elevated CRP-possible viral pneumonitis-I have consulted PCCM for their opinion as well. SLP eval pending to make sure no silent aspiration given history of Bell's palsy but denies any major dysphagia issues Continue incentive spirometry/flutter valve/mobilization with PT/OT  HTN Holding HCTZ-will be on as needed doses of IV Lasix to keep negative balance  GERD PPI  Dementia Appears to be mild Awake/alert this morning Namenda Delirium precautions  Depression Lexapro  History of Bell's palsy  Palliative care Full code for now-extensive discussion with family yesterday-family discussion in progress-they will let us know about CODE STATUS.  Addendum: Discussed again at bedside with Brent Brennan-then with her sister Brent Brennan over the phone-Long discussion-explained overall scenario-probability of chronic aspiration causing pneumonitis-although improved-he is at significant risk of this reoccurring in the future.  Continue treatment with antibiotics-but family now agreeable for DNR.   Underweight: Estimated body mass index is 17.07 kg/m as calculated from the following:   Height as of this encounter: 5\' 10"  (1.778 m).   Weight as of  this encounter: 54 kg.   Code status:   Code Status: DNR   DVT Prophylaxis: enoxaparin (LOVENOX) injection 40 mg Start: 05/30/23 1645   Family Communication: Daughter-Brent Brennan at bedside and then with daughter Brent Brennan over the phone   Disposition Plan: Status is: Inpatient Remains inpatient appropriate because: Severity of illness   Planned Discharge Destination:Home health   Diet: Diet Order             Diet NPO time specified  Diet effective now                     Antimicrobial agents: Anti-infectives (From admission, onward)    Start     Dose/Rate Route Frequency Ordered Stop   05/31/23 1200  Ampicillin-Sulbactam (UNASYN) 3 g in sodium chloride 0.9 % 100 mL IVPB        3 g 200 mL/hr over 30 Minutes Intravenous Every 8 hours 05/31/23 1107     05/30/23 1645  cefTRIAXone (ROCEPHIN) 2 g in sodium chloride 0.9 % 100 mL IVPB  Status:  Discontinued        2 g 200 mL/hr over 30 Minutes Intravenous Every 24 hours 05/30/23 1644 05/31/23 1103   05/30/23 1345  azithromycin (ZITHROMAX) 500 mg in sodium chloride 0.9 % 250 mL IVPB  Status:  Discontinued        500 mg 250 mL/hr over 60 Minutes Intravenous Every 24 hours 05/30/23 1337 05/31/23 1103        MEDICATIONS: Scheduled Meds:  vitamin C  1,000 mg Oral Daily   [START ON 06/01/2023] calcium carbonate  1 tablet Oral Q breakfast   cholecalciferol  2,000 Units Oral Daily   enoxaparin (LOVENOX) injection  40 mg Subcutaneous Q24H   escitalopram  10 mg Oral Daily   guaiFENesin  600 mg Oral BID   levalbuterol  0.63 mg Nebulization TID   memantine  10 mg Oral BID   mirtazapine  15 mg Oral QHS   multivitamin with minerals  1 tablet Oral Daily   pantoprazole  40 mg Oral BID   Continuous Infusions:  ampicillin-sulbactam (UNASYN) IV 3 g (05/31/23 1243)   PRN Meds:.acetaminophen **OR** acetaminophen, hydrALAZINE, melatonin, ondansetron **OR** ondansetron (ZOFRAN) IV, polyethylene glycol, traMADol   I have personally  reviewed following labs and imaging studies  LABORATORY DATA: CBC: Recent Labs  Lab 05/30/23 1337 05/30/23 1655 05/31/23 0233  WBC 10.2 7.8 5.9  NEUTROABS 8.9*  --   --   HGB 12.6* 10.8* 10.4*  HCT 39.4 33.9* 32.1*  MCV 94.3 94.7 92.2  PLT 118* 95* 76*    Basic Metabolic Panel: Recent Labs  Lab 05/30/23 1337 05/30/23 1655 05/31/23 0233  NA 139  --  139  K 4.2  --  3.2*  CL 96*  --  98  CO2 30  --  32  GLUCOSE 123*  --  116*  BUN 26*  --  20  CREATININE 1.00 0.91 0.83  CALCIUM 9.3  --  8.5*    GFR: Estimated Creatinine Clearance: 45.2 mL/min (by C-G formula based on SCr of 0.83 mg/dL).  Liver Function Tests: Recent Labs  Lab 05/30/23 1337 05/31/23 0233  AST 30 17  ALT 10 13  ALKPHOS 63 46  BILITOT 2.3*  1.1  PROT 6.0* 4.7*  ALBUMIN 3.3* 2.4*   No results for input(s): "LIPASE", "AMYLASE" in the last 168 hours. No results for input(s): "AMMONIA" in the last 168 hours.  Coagulation Profile: Recent Labs  Lab 05/30/23 1337  INR 1.6*    Cardiac Enzymes: No results for input(s): "CKTOTAL", "CKMB", "CKMBINDEX", "TROPONINI" in the last 168 hours.  BNP (last 3 results) No results for input(s): "PROBNP" in the last 8760 hours.  Lipid Profile: No results for input(s): "CHOL", "HDL", "LDLCALC", "TRIG", "CHOLHDL", "LDLDIRECT" in the last 72 hours.  Thyroid Function Tests: No results for input(s): "TSH", "T4TOTAL", "FREET4", "T3FREE", "THYROIDAB" in the last 72 hours.  Anemia Panel: No results for input(s): "VITAMINB12", "FOLATE", "FERRITIN", "TIBC", "IRON", "RETICCTPCT" in the last 72 hours.  Urine analysis:    Component Value Date/Time   COLORURINE AMBER (A) 05/31/2023 0946   APPEARANCEUR CLEAR 05/31/2023 0946   LABSPEC 1.027 05/31/2023 0946   PHURINE 5.0 05/31/2023 0946   GLUCOSEU NEGATIVE 05/31/2023 0946   HGBUR NEGATIVE 05/31/2023 0946   BILIRUBINUR NEGATIVE 05/31/2023 0946   KETONESUR 20 (A) 05/31/2023 0946   PROTEINUR 30 (A) 05/31/2023 0946    NITRITE NEGATIVE 05/31/2023 0946   LEUKOCYTESUR NEGATIVE 05/31/2023 0946    Sepsis Labs: Lactic Acid, Venous    Component Value Date/Time   LATICACIDVEN 1.2 05/30/2023 1736    MICROBIOLOGY: Recent Results (from the past 240 hour(s))  Blood Culture (routine x 2)     Status: None (Preliminary result)   Collection Time: 05/30/23  1:37 PM   Specimen: BLOOD RIGHT FOREARM  Result Value Ref Range Status   Specimen Description BLOOD RIGHT FOREARM  Final   Special Requests   Final    BOTTLES DRAWN AEROBIC AND ANAEROBIC Blood Culture adequate volume   Culture   Final    NO GROWTH < 24 HOURS Performed at [facility], [address], [place], [place] [address]    Report Status PENDING  Incomplete  Blood Culture (routine x 2)     Status: None (Preliminary result)   Collection Time: 05/30/23  1:42 PM   Specimen: BLOOD RIGHT ARM  Result Value Ref Range Status   Specimen Description BLOOD RIGHT ARM  Final   Special Requests   Final    BOTTLES DRAWN AEROBIC AND ANAEROBIC Blood Culture adequate volume   Culture   Final    NO GROWTH < 24 HOURS Performed at [facility], [address] [place], [place] [address]    Report Status PENDING  Incomplete  Resp panel by RT-PCR (RSV, Flu A&B, Covid) Anterior Nasal Swab     Status: None   Collection Time: 05/30/23  3:18 PM   Specimen: Anterior Nasal Swab  Result Value Ref Range Status   SARS Coronavirus 2 by RT PCR NEGATIVE NEGATIVE Final   Influenza A by PCR NEGATIVE NEGATIVE Final   Influenza B by PCR NEGATIVE NEGATIVE Final    Comment: (NOTE) The Xpert Xpress SARS-CoV-2/FLU/RSV plus assay is intended as an aid in the diagnosis of influenza from Nasopharyngeal swab specimens and should not be used as a sole basis for treatment. Nasal washings and aspirates are unacceptable for Xpert Xpress SARS-CoV-2/FLU/RSV testing.  Fact Sheet for Patients: BloggerCourse.com  Fact Sheet for Healthcare  Providers: SeriousBroker.it  This test is not yet approved or cleared by the [place] FDA and has been authorized for detection and/or diagnosis of SARS-CoV-2 by FDA under an Emergency Use Authorization (EUA). This EUA will remain in  effect (meaning this test can be used) for the duration of the COVID-19 declaration under Section 564(b)(1) of the Act, 21 U.S.C. section 360bbb-3(b)(1), unless the authorization is terminated or revoked.     Resp Syncytial Virus by PCR NEGATIVE NEGATIVE Final    Comment: (NOTE) Fact Sheet for Patients: BloggerCourse.com  Fact Sheet for Healthcare Providers: SeriousBroker.it  This test is not yet approved or cleared by the [place] FDA and has been authorized for detection and/or diagnosis of SARS-CoV-2 by FDA under an Emergency Use Authorization (EUA). This EUA will remain in effect (meaning this test can be used) for the duration of the COVID-19 declaration under Section 564(b)(1) of the Act, 21 U.S.C. section 360bbb-3(b)(1), unless the authorization is terminated or revoked.  Performed at [facility], [address] [place], [place] [address]   Respiratory (~20 pathogens) panel by PCR     Status: None   Collection Time: 05/30/23  3:39 PM   Specimen: Nasopharyngeal Swab; Respiratory  Result Value Ref Range Status   Adenovirus NOT DETECTED NOT DETECTED Final   Coronavirus 229E NOT DETECTED NOT DETECTED Final    Comment: (NOTE) The Coronavirus on the Respiratory Panel, DOES NOT test for the novel  Coronavirus (2019 nCoV)    Coronavirus HKU1 NOT DETECTED NOT DETECTED Final   Coronavirus NL63 NOT DETECTED NOT DETECTED Final   Coronavirus OC43 NOT DETECTED NOT DETECTED Final   Metapneumovirus NOT DETECTED NOT DETECTED Final   Rhinovirus / Enterovirus NOT DETECTED NOT DETECTED Final   Influenza A NOT DETECTED NOT DETECTED Final   Influenza B NOT DETECTED  NOT DETECTED Final   Parainfluenza Virus 1 NOT DETECTED NOT DETECTED Final   Parainfluenza Virus 2 NOT DETECTED NOT DETECTED Final   Parainfluenza Virus 3 NOT DETECTED NOT DETECTED Final   Parainfluenza Virus 4 NOT DETECTED NOT DETECTED Final   Respiratory Syncytial Virus NOT DETECTED NOT DETECTED Final   Bordetella pertussis NOT DETECTED NOT DETECTED Final   Bordetella Parapertussis NOT DETECTED NOT DETECTED Final   Chlamydophila pneumoniae NOT DETECTED NOT DETECTED Final   Mycoplasma pneumoniae NOT DETECTED NOT DETECTED Final    Comment: Performed at [facility], [address] [place], [place] [address]    RADIOLOGY STUDIES/RESULTS: Portable chest 1 View  Result Date: 05/31/2023 CLINICAL DATA:  Shortness of breath and cough. EXAM: PORTABLE CHEST 1 VIEW COMPARISON:  03/30/2023 FINDINGS: Unchanged asymmetric elevation of the left hemidiaphragm which obscures the left side of the cardiac silhouette. Bilateral pulmonary opacities are noted within the right upper lobe, right lower lobe and right lower lung. These appear increased from the previous exam. Remote healed left posterior rib fractures identified. IMPRESSION: 1. Increased bilateral pulmonary opacities compatible with multifocal pneumonia. 2. Unchanged asymmetric elevation of the left hemidiaphragm. Electronically Signed   By: Signa Kell M.D.   On: 05/31/2023 09:43   DG Chest Port 1 View  Result Date: 05/30/2023 CLINICAL DATA:  Shortness of breath and cough for 2 days. EXAM: PORTABLE CHEST 1 VIEW COMPARISON:  06/09/2010 FINDINGS: Diminished exam detail secondary to rotational artifact. Stable cardiomediastinal contours. Unchanged asymmetric area of a shin of the left hemidiaphragm. Bilateral pulmonary opacities are noted within the left base, right mid and right lower lung. Remote healed left posterior rib fractures. IMPRESSION: Bilateral pulmonary opacities compatible with multifocal pneumonia. Electronically Signed   By:  Signa Kell M.D.   On: 05/30/2023 15:05     LOS: 1 day   Jeoffrey Massed, MD  [facility]    To contact the attending provider between 7A-7P or the covering provider during after hours 7P-7A, please log into the web site [email or web address] and access using universal [place] password for that web site. If you do not have the password, please call the hospital operator.  05/31/2023, 1:20 PM

## 2023-05-31 NOTE — Progress Notes (Signed)
PT Cancellation Note  Patient Details Name: Brent Brennan MRN: [id number] DOB: May 01, 1931   Cancelled Treatment:    Reason Eval/Treat Not Completed: Patient at procedure or test/unavailable  Receiving nursing care. Will return today   Jerolyn Center, PT Acute Rehabilitation Services  Office [phone]   Zena Amos 05/31/2023, 1:59 PM

## 2023-05-31 NOTE — Evaluation (Signed)
Clinical/Bedside Swallow Evaluation Patient Details  Name: Brent Brennan MRN: [id number] Date of Birth: 1931/04/15  Today's Date: 05/31/2023 Time: SLP Start Time (ACUTE ONLY): 1043 SLP Stop Time (ACUTE ONLY): 1058 SLP Time Calculation (min) (ACUTE ONLY): 15 min  Past Medical History:  Past Medical History:  Diagnosis Date   Accidental fall from ladder    resulting in multiple L rib fracturs, traumatic small left hemopneumothorax, left transverse process fracture at T4, T5, and T8 with four-day hospitalization coupled K. by mild from cytopenia and mild hyperglycemia   Arthritis    bilateral hands   Colon polyps    Dementia (HCC)    Diarrhea    IBS, Dr Kinnie Scales- suspect malabsorption/maldigestion due to lactose intolerance give the amount of milk products consumed   Diverticulitis    GERD (gastroesophageal reflux disease)    Gilbert's syndrome    Hiatal hernia    History of rib fracture 2015   fell from ladder   Hypertension    Left inguinal hernia 11/09/2018   Paralysis (HCC)    right facial paralysis   Past Surgical History:  Past Surgical History:  Procedure Laterality Date   ADJACENT TISSUE TRANSFER/TISSUE REARRANGEMENT Right 07/08/2022   Procedure: ADJACENT TISSUE TRANSFER/LOCAL TISSUE REARRANGEMENT OF RIGHT FACE;  Surgeon: Janne Napoleon, MD;  Location: [facility] OR;  Service: Plastics;  Laterality: Right;  2 hours   CATARACT EXTRACTION, BILATERAL     CHOLECYSTECTOMY     COLONOSCOPY     esophagus stretch  02/13/2021   EYE SURGERY     HERNIA REPAIR     Right inguinal   INGUINAL HERNIA REPAIR Left 11/09/2018   Procedure: OPEN REPAIR LEFT INGUINAL HERNIA WITH MESH ERAS PATHWAY;  Surgeon: Claud Kelp, MD;  Location: [facility] OR;  Service: General;  Laterality: Left;  TAP BLOCK   TONSILLECTOMY     as a child   UPPER GASTROINTESTINAL ENDOSCOPY     UPPER GASTROINTESTINAL ENDOSCOPY  02/13/2021   HPI:  Brent Brennan is a 89 yo male presenting to ED 8/16 with increased  disorientation and a dry cough. Presented to PCP with hypoxia and CXR performed there revealed PNA and was sent to ED. Per note, pt had O2 saturations in the low 80s upon presentation to ED and was placed on a NRB with continued desats and was then started on HHFNC. CXR 8/17 with increased bilateral pulmonary opacities compatible with multifocal PNA. Found to be febrile with acute hypoxic respiratory failure. 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 Pt on HHFNC, although reports no sensation of shortness of breath and SpO2 remained ~98. He has a history of Bells Palsy, which greatly impacts pt's R sided facial symmetry and strength. Oral motor exam overall function, but question pt's ability to consistently achieve an adequate lingual seal. He reports no prior diet modifications to compensate for this reduced symmetry/strength. Pt observed with trials of thin liquids, purees, and solids with minimal R anterior spillage and immediate coughing following each bolus. Given concern for repeated aspiration, safest recommendation at this time is NPO. Plan to complete an MBS as scheduling in radiology allows. Will continue to follow.  SLP Visit Diagnosis: Dysphagia, unspecified (R13.10)    Aspiration Risk  Moderate aspiration risk    Diet Recommendation NPO    Medication Administration: Via alternative means  Other  Recommendations Oral Care Recommendations: Oral care QID    Recommendations for follow up therapy are one component of a multi-disciplinary discharge planning process, led by the attending physician.  Recommendations may be updated based on patient status, additional functional criteria and insurance authorization.  Follow up Recommendations Skilled nursing-short term rehab (<3 hours/day)       Assistance Recommended at Discharge    Functional Status Assessment Patient has had a recent decline in their functional status and demonstrates the ability to make significant improvements in function in a reasonable and predictable amount of time.  Frequency and Duration min 2x/week  2 weeks       Prognosis Prognosis for improved oropharyngeal function: Good Barriers to Reach Goals: Cognitive deficits;Time post onset      Swallow Study   General HPI: Brent Brennan is a 89 yo male presenting to ED 8/16 with increased disorientation and a dry cough. Presented to PCP with hypoxia and CXR performed there revealed PNA and was sent to ED. Per note, pt had O2 saturations in the low 80s upon presentation to ED and was placed on a NRB with continued desats and was then started on HHFNC. CXR 8/17 with increased bilateral pulmonary opacities compatible with multifocal PNA. Found to be febrile with acute hypoxic respiratory failure. 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 Type of Study: Bedside Swallow Evaluation Previous Swallow Assessment: none in chart Diet Prior to this Study: Regular;Thin liquids (Level 0) Temperature Spikes Noted: Yes Respiratory Status: Nasal cannula (HHF) History of Recent Intubation: No Behavior/Cognition: Alert;Cooperative;Pleasant mood Oral Cavity Assessment: Within Functional Limits Oral Care Completed by SLP: No Oral Cavity - Dentition: Missing dentition Vision: Functional for self-feeding Self-Feeding Abilities: Able to feed self Patient Positioning: Upright in chair Baseline Vocal Quality: Normal Volitional Cough: Strong Volitional Swallow: Able to elicit    Oral/Motor/Sensory Function Overall Oral Motor/Sensory Function: Severe impairment Facial ROM: Reduced right Facial Symmetry: Abnormal symmetry right Facial Strength: Reduced right Lingual ROM: Reduced right Lingual Symmetry: Abnormal symmetry right   Ice  Chips Ice chips: Not tested   Thin Liquid Thin Liquid: Impaired Presentation: Straw;Self Fed Oral Phase Impairments: Reduced labial seal Pharyngeal  Phase Impairments: Throat Clearing - Immediate;Cough - Immediate    Nectar Thick Nectar Thick Liquid: Not tested   Honey Thick Honey Thick Liquid: Not tested   Puree Puree: Impaired Presentation: Self Fed;Spoon Oral Phase Impairments: Reduced labial seal Pharyngeal Phase Impairments: Throat Clearing - Immediate   Solid     Solid: Impaired Presentation: Self Fed Oral Phase Impairments: Reduced labial seal Pharyngeal Phase Impairments: Cough - Immediate      Gwynneth Aliment, M.A., CF-SLP Speech Language Pathology, Acute Rehabilitation Services  Secure Chat preferred [phone]  05/31/2023,11:14 AM

## 2023-05-31 NOTE — Evaluation (Signed)
Physical Therapy Evaluation Patient Details Name: Brent Brennan MRN: [id number] DOB: [birth year] Today's Date: 05/31/2023  History of Present Illness  89 yo male presenting to ED 8/16 with increased disorientation and a dry cough. 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 PTA patient was independent with all mobility and did not use a device. Pt currently requires 15L O2 via HFNC with sats 100% at rest and decr to 84% with pre-gait activities in standing. On return to sitting, sats incr to 90% in <30 seconds. Expect may have slow progress depending on his pulmonary function. Anticipate patient will benefit from PT to address problems listed below.Will continue to follow acutely to maximize functional mobility independence and safety.           If plan is discharge home, recommend the following: A little help with walking and/or transfers;Assistance with cooking/housework;Assist for transportation;Help with stairs or ramp for entrance   Can travel by private vehicle        Equipment Recommendations None recommended by PT  Recommendations for Other Services       Functional Status Assessment Patient has had a recent decline in their functional status and demonstrates the ability to make significant improvements in function in a reasonable and predictable amount of time.     Precautions / Restrictions Precautions Precautions: Other (comment) Precaution Comments: monitor sats closely Restrictions Weight Bearing Restrictions: No      Mobility  Bed Mobility               General bed mobility comments: up in recliner    Transfers Overall transfer level: Needs assistance Equipment used: None Transfers: Sit to/from Stand Sit to Stand: Contact guard assist           General transfer comment: x 3 reps from recliner;  no imbalance noted; sats drop after ~1 minute to 84%; once seated returned to 90% in <30 sec    Ambulation/Gait             Pre-gait activities: marching x 10 reps with sats dropping to 84%, no dyspnea (pt denies)    Careers information officer     Tilt Bed    Modified Rankin (Stroke Patients Only)       Balance Overall balance assessment: No apparent balance deficits (not formally assessed)                                           Pertinent Vitals/Pain Pain Assessment Pain Assessment: No/denies pain    Home Living Family/patient expects to be discharged to:: Private residence Living Arrangements: Children Available Help at Discharge: Family;Available 24 hours/day (normally pt alone 6-7 hrs/day, but family reports they can provide 24/7) Type of Home: House Home Access: Level entry       Home Layout: One level Home Equipment: Tub bench;BSC/3in1;Rolling Walker (2 wheels);Rollator (4 wheels);Wheelchair - manual;Hand held shower head ([place])      Prior Function Prior Level of Function : Independent/Modified Independent             Mobility Comments: Independent without an AD ADLs Comments: Independent with ADLs, assist with IADLs     Extremity/Trunk Assessment   Upper Extremity Assessment Upper  Extremity Assessment: Defer to OT evaluation    Lower Extremity Assessment Lower Extremity Assessment: Overall WFL for tasks assessed    Cervical / Trunk Assessment Cervical / Trunk Assessment: Normal  Communication   Communication Communication: Hearing impairment Cueing Techniques: Verbal cues  Cognition Arousal: Alert Behavior During Therapy: WFL for tasks assessed/performed Overall Cognitive Status: Within Functional Limits for tasks assessed                                 General Comments: following all commands        General Comments      Exercises     Assessment/Plan    PT  Assessment Patient needs continued PT services  PT Problem List Decreased activity tolerance;Decreased mobility;Decreased knowledge of use of DME;Cardiopulmonary status limiting activity       PT Treatment Interventions DME instruction;Gait training;Functional mobility training;Therapeutic activities;Therapeutic exercise;Balance training;Patient/family education    PT Goals (Current goals can be found in the Care Plan section)  Acute Rehab PT Goals Patient Stated Goal: be able to walk and go home PT Goal Formulation: With patient Time For Goal Achievement: 06/14/23 Potential to Achieve Goals: Good    Frequency Min 1X/week     Co-evaluation               AM-PAC PT "6 Clicks" Mobility  Outcome Measure Help needed turning from your back to your side while in a flat bed without using bedrails?: None Help needed moving from lying on your back to sitting on the side of a flat bed without using bedrails?: A Little Help needed moving to and from a bed to a chair (including a wheelchair)?: A Little Help needed standing up from a chair using your arms (e.g., wheelchair or bedside chair)?: A Little Help needed to walk in hospital room?: Total Help needed climbing 3-5 steps with a railing? : Total 6 Click Score: 15    End of Session Equipment Utilized During Treatment: Oxygen Activity Tolerance: Treatment limited secondary to medical complications (Comment) (drop in sats on 15L HFNC) Patient left: in chair;with call bell/phone within reach;with chair alarm set;with family/visitor present   PT Visit Diagnosis: Difficulty in walking, not elsewhere classified (R26.2)    Time: 4098-1191 PT Time Calculation (min) (ACUTE ONLY): 11 min   Charges:   PT Evaluation $PT Eval Low Complexity: 1 Low   PT General Charges $$ ACUTE PT VISIT: 1 Visit          Jerolyn Center, PT Acute Rehabilitation Services  Office [phone]   Zena Amos 05/31/2023, 3:10 PM

## 2023-05-31 NOTE — Progress Notes (Signed)
Bedside nurse reported that speech and swallow evaluated patient in the afternoon and recommended do not give anything by mouth.  I have reviewed speech and swallow consult note and per their evaluation patient supposed not to give anything by mouth even or any medication as there is very high risk of aspiration. -Instructed nighttime nurse to hold any medication by mouth. - Patient and family discussion at daytime for NG versus PEG tube placement. - Starting maintenance fluid D5 LR for interim hydration.  Tereasa Coop, MD [facility] 05/31/2023, 8:44 PM

## 2023-05-31 NOTE — Plan of Care (Signed)
  Problem: Education: Goal: Knowledge of General Education information will improve Description: Including pain rating scale, medication(s)/side effects and non-pharmacologic comfort measures Outcome: Progressing   Problem: Clinical Measurements: Goal: Respiratory complications will improve Outcome: Progressing   Problem: Activity: Goal: Risk for activity intolerance will decrease Outcome: Progressing   Problem: Elimination: Goal: Will not experience complications related to urinary retention Outcome: Progressing   Problem: Safety: Goal: Ability to remain free from injury will improve Outcome: Progressing

## 2023-06-01 DIAGNOSIS — J9601 Acute respiratory failure with hypoxia: Secondary | ICD-10-CM | POA: Diagnosis not present

## 2023-06-01 DIAGNOSIS — I159 Secondary hypertension, unspecified: Secondary | ICD-10-CM | POA: Diagnosis not present

## 2023-06-01 DIAGNOSIS — F0390 Unspecified dementia without behavioral disturbance: Secondary | ICD-10-CM | POA: Diagnosis not present

## 2023-06-01 DIAGNOSIS — K219 Gastro-esophageal reflux disease without esophagitis: Secondary | ICD-10-CM | POA: Diagnosis not present

## 2023-06-01 LAB — BASIC METABOLIC PANEL
Anion gap: 9 (ref 5–15)
BUN: 21 mg/dL (ref 8–23)
CO2: 33 mmol/L — ABNORMAL HIGH (ref 22–32)
Calcium: 8.4 mg/dL — ABNORMAL LOW (ref 8.9–10.3)
Chloride: 99 mmol/L (ref 98–111)
Creatinine, Ser: 0.84 mg/dL (ref 0.61–1.24)
GFR, Estimated: >60 mL/min (ref >60)
Glucose, Bld: 139 mg/dL — ABNORMAL HIGH (ref 70–99)
Potassium: 3.1 mmol/L — ABNORMAL LOW (ref 3.5–5.1)
Sodium: 141 mmol/L (ref 135–145)

## 2023-06-01 MED ORDER — HALOPERIDOL LACTATE 5 MG/ML IJ SOLN
1.0000 mg | Freq: Four times a day (QID) | INTRAMUSCULAR | Status: DC | PRN
  Administered 2023-06-01 – 2023-06-03 (×3): 1 mg via INTRAVENOUS
  Filled 2023-06-01 (×3): qty 1

## 2023-06-01 NOTE — Plan of Care (Signed)

## 2023-06-01 NOTE — Progress Notes (Signed)
     Referral received for Brent Brennan: goals of care discussion. Chart reviewed and updates received from RN. I was able to speak with patient's daughter [name]. She would like to meet once her sister [name] is in town from [place]. GOC meeting scheduled for Tuesday TBD. Family is aware we will meet at patient's bedside.   Thank you for your referral and allowing PMT to assist in [name]'s care.   Sarina Ser, NP Palliative Medicine Team  Team Phone # [phone]   NO CHARGE

## 2023-06-01 NOTE — Progress Notes (Signed)
Bedside nurse reported that patient is very agitated and not taking oral Lexapro. - Ordered Haldol 1 mg every 6 hrs as needed for agitation. -Need to continue frequent orientation.  Family at bedside. - Continue fall precaution  Tereasa Coop, MD [facility] 06/01/2023, 9:41 PM

## 2023-06-01 NOTE — Progress Notes (Addendum)
PROGRESS NOTE        PATIENT DETAILS Name: Brent Brennan Age: 89 y.o. Sex: male Date of Birth: 03/10/1931 Admit Date: 05/30/2023 Admitting Physician Dewayne Shorter Levora Dredge, MD ION:GEXB, Ravisankar, MD  Brief Summary: Patient is a 89 y.o.  male prior history of Bell's palsy, HTN, dementia, depression, GERD-who apparently had a "bout" of PNA in June-and was treated as an outpatient with oral antimicrobial therapy-presented with cough-upon further evaluation-he was found to have fever and acute hypoxic respiratory failure in the setting of multifocal pneumonia.  Patient was started on heated high flow oxygen in the ED and subsequently admitted to the hospitalist service.  Significant events: 8/16>> admit to [facility]-hypoxia-heated high flow  Significant studies: 8/16>> CXR: Bilateral pulmonary opacities 8/17>> HRCT chest: Multilobar bronchopneumonia  Significant microbiology data: 8/16>> COVID/influenza/RSV PCR: Negative 8/16>> respiratory virus panel: Negative 8/16>> blood cultures: Negative  Procedures: None  Consults: PCCM  Subjective: Comfortable-some cough-FiO2 down to 6-7 L.  Objective: Vitals: Blood pressure (!) 127/51, pulse (!) 54, temperature 98.1 F (36.7 C), temperature source Oral, resp. rate 14, height 5\' 10"  (1.778 m), weight 54 kg, SpO2 98%.   Exam: Gen Exam:Alert awake-not in any distress HEENT:atraumatic, normocephalic Chest: Bibasilar rales CVS:S1S2 regular Abdomen:soft non tender, non distended Extremities:no edema Neurology: Non focal-chronic right-sided facial weakness from prior Bell's palsy. Skin: no rash  Pertinent Labs/Radiology:    Latest Ref Rng & Units 05/31/2023    2:33 AM 05/30/2023    4:55 PM 05/30/2023    1:37 PM  CBC  WBC 4.0 - 10.5 K/uL 5.9  7.8  10.2   Hemoglobin 13.0 - 17.0 g/dL 28.4  13.2  44.0   Hematocrit 39.0 - 52.0 % 32.1  33.9  39.4   Platelets 150 - 400 K/uL 76  95  118     Lab Results  Component  Value Date   NA 141 06/01/2023   K 3.1 (L) 06/01/2023   CL 99 06/01/2023   CO2 33 (H) 06/01/2023      Assessment/Plan: Acute hypoxic respiratory failure secondary to multifocal pneumonia Felt to be secondary to aspiration pneumonia (elderly/Bell's palsy with right facial droop/history of esophageal issues requiring dilatation) Thankfully improving with IV Unasyn and other supportive care FiO2 down to 6-7 L of HFNC-was on heated high flow on admission Continue to keep n.p.o-does acknowledge intermittent issues with dysphagia-specially with solid food getting "stuck" in his throat. SLP following-probably will require modified barium swallow and a barium esophagogram at some point. If his FiO2 continues to improve-suspect he may be a candidate for EGD/dilatation if significant abnormality seen on barium esophagogram. In the interim-continue with Unasyn and other supportive care Mobilize/incentive spirometry/flutter valve Continue attempt to titrate down FiO2  HTN Holding HCTZ-will be on as needed doses of IV Lasix to keep negative balance  GERD/esophageal stricture PPI See above-has had recent esophageal dilatation  Dementia Appears to be mild Awake/alert this morning Namenda Delirium precautions  Depression Lexapro  History of Bell's palsy with right-sided facial weakness  Palliative care Suspect has had chronic aspiration issues for a while-had pneumonia in June of this year as well-this was treated with antibiotics in the outpatient setting. Unfortunately-patient is elderly with known GI/neuromuscular issues (dementia/Bell's palsy)-and is at risk for decompensation from recurrent aspiration pneumonitis. After extensive discussion with family on 8/17-he is now a DNR Palliative care to continue to  engage family and have ongoing goals of care discussion.  Underweight: Estimated body mass index is 17.07 kg/m as calculated from the following:   Height as of this encounter:  5\' 10"  (1.778 m).   Weight as of this encounter: 54 kg.   Code status:   Code Status: DNR   DVT Prophylaxis: enoxaparin (LOVENOX) injection 40 mg Start: 05/30/23 1645   Family Communication: Daughter-Melissa at bedside on 8/18   Disposition Plan: Status is: Inpatient Remains inpatient appropriate because: Severity of illness   Planned Discharge Destination:Home health   Diet: Diet Order             Diet NPO time specified  Diet effective now                     Antimicrobial agents: Anti-infectives (From admission, onward)    Start     Dose/Rate Route Frequency Ordered Stop   05/31/23 1200  Ampicillin-Sulbactam (UNASYN) 3 g in sodium chloride 0.9 % 100 mL IVPB        3 g 200 mL/hr over 30 Minutes Intravenous Every 8 hours 05/31/23 1107     05/30/23 1645  cefTRIAXone (ROCEPHIN) 2 g in sodium chloride 0.9 % 100 mL IVPB  Status:  Discontinued        2 g 200 mL/hr over 30 Minutes Intravenous Every 24 hours 05/30/23 1644 05/31/23 1103   05/30/23 1345  azithromycin (ZITHROMAX) 500 mg in sodium chloride 0.9 % 250 mL IVPB  Status:  Discontinued        500 mg 250 mL/hr over 60 Minutes Intravenous Every 24 hours 05/30/23 1337 05/31/23 1103        MEDICATIONS: Scheduled Meds:  vitamin C  1,000 mg Oral Daily   calcium carbonate  1 tablet Oral Q breakfast   cholecalciferol  2,000 Units Oral Daily   enoxaparin (LOVENOX) injection  40 mg Subcutaneous Q24H   escitalopram  10 mg Oral Daily   guaiFENesin  600 mg Oral BID   levalbuterol  0.63 mg Nebulization TID   memantine  10 mg Oral BID   mirtazapine  15 mg Oral QHS   multivitamin with minerals  1 tablet Oral Daily   pantoprazole  40 mg Oral BID   Continuous Infusions:  ampicillin-sulbactam (UNASYN) IV 3 g (06/01/23 0354)   dextrose 5% lactated ringers 40 mL/hr at 06/01/23 0710   PRN Meds:.acetaminophen **OR** acetaminophen, hydrALAZINE, melatonin, ondansetron **OR** ondansetron (ZOFRAN) IV, polyethylene glycol,  traMADol   I have personally reviewed following labs and imaging studies  LABORATORY DATA: CBC: Recent Labs  Lab 05/30/23 1337 05/30/23 1655 05/31/23 0233  WBC 10.2 7.8 5.9  NEUTROABS 8.9*  --   --   HGB 12.6* 10.8* 10.4*  HCT 39.4 33.9* 32.1*  MCV 94.3 94.7 92.2  PLT 118* 95* 76*    Basic Metabolic Panel: Recent Labs  Lab 05/30/23 1337 05/30/23 1655 05/31/23 0233 06/01/23 0728  NA 139  --  139 141  K 4.2  --  3.2* 3.1*  CL 96*  --  98 99  CO2 30  --  32 33*  GLUCOSE 123*  --  116* 139*  BUN 26*  --  20 21  CREATININE 1.00 0.91 0.83 0.84  CALCIUM 9.3  --  8.5* 8.4*    GFR: Estimated Creatinine Clearance: 44.6 mL/min (by C-G formula based on SCr of 0.84 mg/dL).  Liver Function Tests: Recent Labs  Lab 05/30/23 1337 05/31/23 0233  AST 30 17  ALT 10 13  ALKPHOS 63 46  BILITOT 2.3* 1.1  PROT 6.0* 4.7*  ALBUMIN 3.3* 2.4*   No results for input(s): "LIPASE", "AMYLASE" in the last 168 hours. No results for input(s): "AMMONIA" in the last 168 hours.  Coagulation Profile: Recent Labs  Lab 05/30/23 1337  INR 1.6*    Cardiac Enzymes: No results for input(s): "CKTOTAL", "CKMB", "CKMBINDEX", "TROPONINI" in the last 168 hours.  BNP (last 3 results) No results for input(s): "PROBNP" in the last 8760 hours.  Lipid Profile: No results for input(s): "CHOL", "HDL", "LDLCALC", "TRIG", "CHOLHDL", "LDLDIRECT" in the last 72 hours.  Thyroid Function Tests: No results for input(s): "TSH", "T4TOTAL", "FREET4", "T3FREE", "THYROIDAB" in the last 72 hours.  Anemia Panel: No results for input(s): "VITAMINB12", "FOLATE", "FERRITIN", "TIBC", "IRON", "RETICCTPCT" in the last 72 hours.  Urine analysis:    Component Value Date/Time   COLORURINE AMBER (A) 05/31/2023 0946   APPEARANCEUR CLEAR 05/31/2023 0946   LABSPEC 1.027 05/31/2023 0946   PHURINE 5.0 05/31/2023 0946   GLUCOSEU NEGATIVE 05/31/2023 0946   HGBUR NEGATIVE 05/31/2023 0946   BILIRUBINUR NEGATIVE  05/31/2023 0946   KETONESUR 20 (A) 05/31/2023 0946   PROTEINUR 30 (A) 05/31/2023 0946   NITRITE NEGATIVE 05/31/2023 0946   LEUKOCYTESUR NEGATIVE 05/31/2023 0946    Sepsis Labs: Lactic Acid, Venous    Component Value Date/Time   LATICACIDVEN 1.2 05/30/2023 1736    MICROBIOLOGY: Recent Results (from the past 240 hour(s))  Blood Culture (routine x 2)     Status: None (Preliminary result)   Collection Time: 05/30/23  1:37 PM   Specimen: BLOOD RIGHT FOREARM  Result Value Ref Range Status   Specimen Description BLOOD RIGHT FOREARM  Final   Special Requests   Final    BOTTLES DRAWN AEROBIC AND ANAEROBIC Blood Culture adequate volume   Culture   Final    NO GROWTH 2 DAYS Performed at [facility], [address], [place], [place] [address]    Report Status PENDING  Incomplete  Blood Culture (routine x 2)     Status: None (Preliminary result)   Collection Time: 05/30/23  1:42 PM   Specimen: BLOOD RIGHT ARM  Result Value Ref Range Status   Specimen Description BLOOD RIGHT ARM  Final   Special Requests   Final    BOTTLES DRAWN AEROBIC AND ANAEROBIC Blood Culture adequate volume   Culture   Final    NO GROWTH 2 DAYS Performed at [facility], [address] [place], [place] [address]    Report Status PENDING  Incomplete  Resp panel by RT-PCR (RSV, Flu A&B, Covid) Anterior Nasal Swab     Status: None   Collection Time: 05/30/23  3:18 PM   Specimen: Anterior Nasal Swab  Result Value Ref Range Status   SARS Coronavirus 2 by RT PCR NEGATIVE NEGATIVE Final   Influenza A by PCR NEGATIVE NEGATIVE Final   Influenza B by PCR NEGATIVE NEGATIVE Final    Comment: (NOTE) The Xpert Xpress SARS-CoV-2/FLU/RSV plus assay is intended as an aid in the diagnosis of influenza from Nasopharyngeal swab specimens and should not be used as a sole basis for treatment. Nasal washings and aspirates are unacceptable for Xpert Xpress SARS-CoV-2/FLU/RSV testing.  Fact Sheet for  Patients: BloggerCourse.com  Fact Sheet for Healthcare Providers: SeriousBroker.it  This test is not yet approved or cleared by the [place] FDA and has been authorized for detection and/or diagnosis of SARS-CoV-2 by FDA under an Emergency  Use Authorization (EUA). This EUA will remain in effect (meaning this test can be used) for the duration of the COVID-19 declaration under Section 564(b)(1) of the Act, 21 U.S.C. section 360bbb-3(b)(1), unless the authorization is terminated or revoked.     Resp Syncytial Virus by PCR NEGATIVE NEGATIVE Final    Comment: (NOTE) Fact Sheet for Patients: BloggerCourse.com  Fact Sheet for Healthcare Providers: SeriousBroker.it  This test is not yet approved or cleared by the [place] FDA and has been authorized for detection and/or diagnosis of SARS-CoV-2 by FDA under an Emergency Use Authorization (EUA). This EUA will remain in effect (meaning this test can be used) for the duration of the COVID-19 declaration under Section 564(b)(1) of the Act, 21 U.S.C. section 360bbb-3(b)(1), unless the authorization is terminated or revoked.  Performed at [facility], [address], [place], [place] [address]   Respiratory (~20 pathogens) panel by PCR     Status: None   Collection Time: 05/30/23  3:39 PM   Specimen: Nasopharyngeal Swab; Respiratory  Result Value Ref Range Status   Adenovirus NOT DETECTED NOT DETECTED Final   Coronavirus 229E NOT DETECTED NOT DETECTED Final    Comment: (NOTE) The Coronavirus on the Respiratory Panel, DOES NOT test for the novel  Coronavirus (2019 nCoV)    Coronavirus HKU1 NOT DETECTED NOT DETECTED Final   Coronavirus NL63 NOT DETECTED NOT DETECTED Final   Coronavirus OC43 NOT DETECTED NOT DETECTED Final   Metapneumovirus NOT DETECTED NOT DETECTED Final   Rhinovirus / Enterovirus NOT DETECTED NOT  DETECTED Final   Influenza A NOT DETECTED NOT DETECTED Final   Influenza B NOT DETECTED NOT DETECTED Final   Parainfluenza Virus 1 NOT DETECTED NOT DETECTED Final   Parainfluenza Virus 2 NOT DETECTED NOT DETECTED Final   Parainfluenza Virus 3 NOT DETECTED NOT DETECTED Final   Parainfluenza Virus 4 NOT DETECTED NOT DETECTED Final   Respiratory Syncytial Virus NOT DETECTED NOT DETECTED Final   Bordetella pertussis NOT DETECTED NOT DETECTED Final   Bordetella Parapertussis NOT DETECTED NOT DETECTED Final   Chlamydophila pneumoniae NOT DETECTED NOT DETECTED Final   Mycoplasma pneumoniae NOT DETECTED NOT DETECTED Final    Comment: Performed at [facility], [address], [place], [place] [address]    RADIOLOGY STUDIES/RESULTS: CT Chest High Resolution  Result Date: 05/31/2023 CLINICAL DATA:  Inpatient. Dyspnea and cough. Respiratory illness. Abnormal chest radiograph. EXAM: CT CHEST WITHOUT CONTRAST TECHNIQUE: Multidetector CT imaging of the chest was performed following the standard protocol without intravenous contrast. High resolution imaging of the lungs, as well as inspiratory and expiratory imaging, was performed. RADIATION DOSE REDUCTION: This exam was performed according to the departmental dose-optimization program which includes automated exposure control, adjustment of the mA and/or kV according to patient size and/or use of iterative reconstruction technique. COMPARISON:  Chest radiograph from earlier today. 06/05/2010 chest CT. FINDINGS: Cardiovascular: Normal heart size. No significant pericardial effusion/thickening. Three-vessel coronary atherosclerosis. Atherosclerotic nonaneurysmal thoracic aorta. Normal caliber pulmonary arteries. Mediastinum/Nodes: No significant thyroid nodules. Unremarkable esophagus. No pathologically enlarged axillary, mediastinal or hilar lymph nodes, noting limited sensitivity for the detection of hilar adenopathy on this noncontrast study.  Lungs/Pleura: No pneumothorax. Small layering bilateral pleural effusions. Mild layering patchy mucoid debris in the right mainstem bronchus and bronchus intermedius. Extensive patchy peribronchovascular consolidation (occasionally vaguely nodular as in the right middle lobe on series 6/image 126) and ground-glass opacity throughout both lungs with scattered regions of prominent centrilobular ground-glass micronodularity in the lungs.  Bandlike consolidation with air bronchograms and some volume loss in the posterior lower lobes bilaterally and in the inferior lingula. Mild cylindrical bronchiectasis in the inferior lingula. No significant regions of subpleural reticulation or frank honeycombing. No significant lobular air trapping or evidence of tracheobronchomalacia on the expiration sequence. No discrete lung masses. No central airway stenoses. Upper abdomen: Chronic prominent elevation of the left hemidiaphragm. Cholecystectomy. Numerous simple bilateral upper renal cysts, largest 6.7 cm on the right, for which no follow-up imaging is recommended. Marked left colonic diverticulosis. Musculoskeletal: No aggressive appearing focal osseous lesions. Moderate superior T12 vertebral compression fracture of uncertain chronicity, new since 06/05/2010 chest CT, favor subacute. Moderate thoracic spondylosis. IMPRESSION: 1. Multilobar bronchopneumonia. Extensive patchy peribronchovascular consolidation and ground-glass opacity throughout both lungs with scattered regions of prominent centrilobular ground-glass micronodularity. Bandlike consolidation with air bronchograms and some volume loss in the posterior lower lobes bilaterally and in the inferior lingula. 2. Mild cylindrical bronchiectasis in the inferior lingula. No findings to suggest underlying interstitial lung disease. 3. Small layering bilateral pleural effusions. 4. Chronic prominent elevation of the left hemidiaphragm. 5. Three-vessel coronary atherosclerosis.  6. Marked left colonic diverticulosis. 7. Moderate superior T12 vertebral compression fracture of uncertain chronicity, new since 06/05/2010 chest CT, favor subacute, correlate with injury history and directed clinical exam. 8.  Aortic Atherosclerosis (ICD10-I70.0). Electronically Signed   By: Delbert Phenix M.D.   On: 05/31/2023 16:23   Portable chest 1 View  Result Date: 05/31/2023 CLINICAL DATA:  Shortness of breath and cough. EXAM: PORTABLE CHEST 1 VIEW COMPARISON:  03/30/2023 FINDINGS: Unchanged asymmetric elevation of the left hemidiaphragm which obscures the left side of the cardiac silhouette. Bilateral pulmonary opacities are noted within the right upper lobe, right lower lobe and right lower lung. These appear increased from the previous exam. Remote healed left posterior rib fractures identified. IMPRESSION: 1. Increased bilateral pulmonary opacities compatible with multifocal pneumonia. 2. Unchanged asymmetric elevation of the left hemidiaphragm. Electronically Signed   By: Signa Kell M.D.   On: 05/31/2023 09:43   DG Chest Port 1 View  Result Date: 05/30/2023 CLINICAL DATA:  Shortness of breath and cough for 2 days. EXAM: PORTABLE CHEST 1 VIEW COMPARISON:  06/09/2010 FINDINGS: Diminished exam detail secondary to rotational artifact. Stable cardiomediastinal contours. Unchanged asymmetric area of a shin of the left hemidiaphragm. Bilateral pulmonary opacities are noted within the left base, right mid and right lower lung. Remote healed left posterior rib fractures. IMPRESSION: Bilateral pulmonary opacities compatible with multifocal pneumonia. Electronically Signed   By: Signa Kell M.D.   On: 05/30/2023 15:05     LOS: 2 days   Jeoffrey Massed, MD  [facility]    To contact the attending provider between 7A-7P or the covering provider during after hours 7P-7A, please log into the web site [email or web address] and access using universal [place] password for that web site. If you  do not have the password, please call the hospital operator.  06/01/2023, 10:54 AM

## 2023-06-01 NOTE — Progress Notes (Signed)
  D/w [facility] MD Dr Lucrezia Starch  Patient doing better CCM can sign off    SIGNATURE    Dr. Kalman Shan, M.D., F.C.C.P,  Pulmonary and Critical Care Medicine Staff Physician, [facility] Director - Interstitial Lung Disease  Program  Pulmonary Fibrosis [facility] at [facility] [place], [place], [address]   Pager: [phone], If no answer  -> Check AMION or Try [phone] Telephone (clinical office): [phone] Telephone (research): [phone]  3:40 PM 06/01/2023

## 2023-06-01 NOTE — Plan of Care (Signed)

## 2023-06-01 NOTE — Progress Notes (Signed)
Physical Therapy Treatment Patient Details Name: Brent Brennan MRN: [id number] DOB: 12-20-1930 Today's Date: 06/01/2023   History of Present Illness 89 yo male presenting to ED 8/16 with increased disorientation and a dry cough. +pna, acute respiratory failure  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    PT Comments  Patient doing well today and able to ambulate on 6L O2 with sats 99% for 90 feet. Required instruction for proper use of RW. Pt limited by dizziness with VSS on return to room and BP assessment.     If plan is discharge home, recommend the following: A little help with walking and/or transfers;Assistance with cooking/housework;Assist for transportation;Help with stairs or ramp for entrance   Can travel by private vehicle        Equipment Recommendations  None recommended by PT    Recommendations for Other Services       Precautions / Restrictions Precautions Precautions: Fall;Other (comment) Precaution Comments: monitor sats closely Restrictions Weight Bearing Restrictions: No     Mobility  Bed Mobility Overal bed mobility: Needs Assistance Bed Mobility: Supine to Sit     Supine to sit: Supervision, HOB elevated, Used rails          Transfers Overall transfer level: Needs assistance Equipment used: Rolling walker (2 wheels) Transfers: Sit to/from Stand Sit to Stand: Contact guard assist                Ambulation/Gait Ambulation/Gait assistance: Contact guard assist Gait Distance (Feet): 90 Feet Assistive device: Rolling walker (2 wheels) Gait Pattern/deviations: Step-through pattern, Decreased stride length   Gait velocity interpretation: 1.31 - 2.62 ft/sec, indicative of limited community ambulator   General Gait Details: distance limited by pt reports of dizziness; BP on return to room 126/80; good managing RW except likes to lift it when turning; on 6L with  sats 99% throughout   Stairs              Wheelchair Mobility     Tilt Bed    Modified Rankin (Stroke Patients Only)       Balance Overall balance assessment: Mild deficits observed, not formally tested                                          Cognition Arousal: Alert Behavior During Therapy: WFL for tasks assessed/performed Overall Cognitive Status: History of cognitive impairments - at baseline                                          Exercises      General Comments        Pertinent Vitals/Pain Pain Assessment Pain Assessment: No/denies pain    Home Living                          Prior Function            PT Goals (current goals can now be found in the care plan section) Acute Rehab PT Goals Patient Stated Goal: be able to walk and go home Time For Goal Achievement: 06/14/23 Potential to Achieve Goals: Good Progress towards PT goals: Progressing toward goals    Frequency    Min 1X/week      PT  Plan      Co-evaluation              AM-PAC PT "6 Clicks" Mobility   Outcome Measure  Help needed turning from your back to your side while in a flat bed without using bedrails?: None Help needed moving from lying on your back to sitting on the side of a flat bed without using bedrails?: A Little Help needed moving to and from a bed to a chair (including a wheelchair)?: A Little Help needed standing up from a chair using your arms (e.g., wheelchair or bedside chair)?: A Little Help needed to walk in hospital room?: A Little Help needed climbing 3-5 steps with a railing? : A Little 6 Click Score: 19    End of Session Equipment Utilized During Treatment: Oxygen;Gait belt Activity Tolerance: Treatment limited secondary to medical complications (Comment) (reports of dizziness) Patient left: in chair;with call bell/phone within reach;with chair alarm set   PT Visit Diagnosis: Difficulty in walking, not elsewhere classified (R26.2)      Time: 2725-3664 PT Time Calculation (min) (ACUTE ONLY): 23 min  Charges:    $Gait Training: 23-37 mins PT General Charges $$ ACUTE PT VISIT: 1 Visit                      Jerolyn Center, PT Acute Rehabilitation Services  Office [phone]    Zena Amos 06/01/2023, 3:17 PM

## 2023-06-02 ENCOUNTER — Inpatient Hospital Stay (HOSPITAL_COMMUNITY): Payer: Medicare Other

## 2023-06-02 DIAGNOSIS — I159 Secondary hypertension, unspecified: Secondary | ICD-10-CM | POA: Diagnosis not present

## 2023-06-02 DIAGNOSIS — J9601 Acute respiratory failure with hypoxia: Secondary | ICD-10-CM | POA: Diagnosis not present

## 2023-06-02 DIAGNOSIS — Z515 Encounter for palliative care: Secondary | ICD-10-CM

## 2023-06-02 DIAGNOSIS — K219 Gastro-esophageal reflux disease without esophagitis: Secondary | ICD-10-CM | POA: Diagnosis not present

## 2023-06-02 DIAGNOSIS — Z7189 Other specified counseling: Secondary | ICD-10-CM | POA: Diagnosis not present

## 2023-06-02 DIAGNOSIS — F0390 Unspecified dementia without behavioral disturbance: Secondary | ICD-10-CM | POA: Diagnosis not present

## 2023-06-02 LAB — BASIC METABOLIC PANEL
Anion gap: 9 (ref 5–15)
BUN: 18 mg/dL (ref 8–23)
CO2: 32 mmol/L (ref 22–32)
Calcium: 8.4 mg/dL — ABNORMAL LOW (ref 8.9–10.3)
Chloride: 102 mmol/L (ref 98–111)
Creatinine, Ser: 0.69 mg/dL (ref 0.61–1.24)
GFR, Estimated: >60 mL/min (ref >60)
Glucose, Bld: 118 mg/dL — ABNORMAL HIGH (ref 70–99)
Potassium: 3.1 mmol/L — ABNORMAL LOW (ref 3.5–5.1)
Sodium: 143 mmol/L (ref 135–145)

## 2023-06-02 LAB — CBC
HCT: 33.3 % — ABNORMAL LOW (ref 39.0–52.0)
Hemoglobin: 10.5 g/dL — ABNORMAL LOW (ref 13.0–17.0)
MCH: 30 pg (ref 26.0–34.0)
MCHC: 31.5 g/dL (ref 30.0–36.0)
MCV: 95.1 fL (ref 80.0–100.0)
Platelets: 106 10*3/uL — ABNORMAL LOW (ref 150–400)
RBC: 3.5 MIL/uL — ABNORMAL LOW (ref 4.22–5.81)
RDW: 12.8 % (ref 11.5–15.5)
WBC: 5.2 10*3/uL (ref 4.0–10.5)
nRBC: 0 % (ref 0.0–0.2)

## 2023-06-02 MED ORDER — POTASSIUM CHLORIDE 10 MEQ/100ML IV SOLN
10.0000 meq | INTRAVENOUS | Status: AC
  Administered 2023-06-02: 10 meq via INTRAVENOUS
  Filled 2023-06-02: qty 100

## 2023-06-02 MED ORDER — LEVALBUTEROL HCL 0.63 MG/3ML IN NEBU: 0.6300 mg | INHALATION_SOLUTION | RESPIRATORY_TRACT | Status: DC | PRN

## 2023-06-02 MED ORDER — POTASSIUM CHLORIDE 10 MEQ/100ML IV SOLN
10.0000 meq | INTRAVENOUS | Status: AC
  Administered 2023-06-02 (×2): 10 meq via INTRAVENOUS
  Filled 2023-06-02 (×2): qty 100

## 2023-06-02 NOTE — Consult Note (Signed)
Palliative Medicine Inpatient Consult Note  Consulting Provider: Tereasa Coop, MD   Reason for consult:   Palliative Care Consult Services Palliative Medicine Consult  Reason for Consult? Discussed goal of care with patient's family.  He cannot take anything by mouth due to high risk of aspiration   06/02/2023  HPI:  Per intake H&P --> Patient is a 89 y.o.  male prior history of Bell's palsy, HTN, dementia, depression, GERD-who apparently had a "bout" of PNA in June-and was treated as an outpatient with oral antimicrobial therapy-presented with cough-upon further evaluation-he was found to have fever and acute hypoxic respiratory failure in the setting of multifocal pneumonia.  Patient was started on heated high flow oxygen in the ED and subsequently admitted to the hospitalist service.   Palliative care has been requested for additional goals of care conversations.  Clinical Assessment/Goals of Care:  *Please note that this is a verbal dictation therefore any spelling or grammatical errors are due to the "Dragon Medical One" system interpretation.  I have reviewed medical records including EPIC notes, labs and imaging, received report from bedside RN, assessed the patient.    I met with patients daughter, [name] to further discuss diagnosis prognosis, GOC, EOL wishes, disposition and options.   I introduced Palliative Medicine as specialized medical care for people living with serious illness. It focuses on providing relief from the symptoms and stress of a serious illness. The goal is to improve quality of life for both the patient and the family.  Medical History Review and Understanding:  A review of Lee's health history was completed inclusive of his bell's palsy, HTN, dementia, depression, GERD, and recent PNA.   Social History:  Shaquel is from [place], [place]. He has lived here throughout his life. He is formerly an Armed forces technical officer. He is a a widower and  had been married for 66 years. He has two daughters. He is a man of the [place] faith.   Danne [place] enjoy's Justice Rocher and word search puzzles.   Functional and Nutritional State:  Culley's status was good preceding admission. He was fully functional of all bADL's and iADL's. Per [name] he was very well functioning and ate "junk food".   Advance Directives:  A detailed discussion was had today regarding advanced directives.  Patients decision makers are his daughters.   Code Status:  Concepts specific to code status, artifical feeding and hydration, continued IV antibiotics and rehospitalization was had.  The difference between a aggressive medical intervention path  and a palliative comfort care path for this patient at this time was had.   [place] is an established DNAR/DNI code status.  Provided  "Hard Choices for [employer]" booklet as well as a MOST form for review.   Discussion:  Per Melissa, Gerrit's health decline started after the death of his spouse three years ago and after his diagnosis of Bell's Palsy. He has had some intermittent issues as a result of this. We discussed his recent hospitalization for PNA in June.   Torrell was admitted in the setting of hypoxemic respiratory failure from a PNA. Discussed the concern of aspirational events as patient has significant bell's palsy and as of this admission did not pass his swallow evaluation. [name] shares previously he had not been a candidate for an esophageal diagnostics nor intervention though she is hopeful that perhaps he can tolerate an esophagram. He apparently had a very bad sinus infection in the past with notable swelling in his neck area (2022).  We discussed the importance of additional conversation moving into the future in the setting of likely recurrent aspirational events. Plan at this time will be a meeting on Wednesday at 1300 once Alasdair's other daughter, [name] returns from [place].   Discussed the  importance of continued conversation with family and their  medical providers regarding overall plan of care and treatment options, ensuring decisions are within the context of the patients values and GOCs.  Decision Maker: Takahashi,Melissa (Daughter): [phone] (Mobile)   SUMMARY OF RECOMMENDATIONS   DNAR/DNI  Continue current care allowing time for outcomes  Plan for family meeting on Wednesday at 1300 ad patients other daughter returns from [place] then  Ongoing PMT support   Code Status/Advance Care Planning: DNAR/DNI  Palliative Prophylaxis:  Aspiration, Bowel Regimen, Delirium Protocol, Frequent Pain Assessment, Oral Care, Palliative Wound Care, and Turn Reposition  Additional Recommendations (Limitations, Scope, Preferences): Continue current care  Psycho-social/Spiritual:  Desire for further Chaplaincy support: Not at this time [facility] members visit) Additional Recommendations: Education on aspiration Pneumonia.    Prognosis: Concern for recurrent aspiration events which could lead to demise rather quickly.  Discharge Planning: Unclear presently.   Vitals:   06/02/23 0310 06/02/23 0735  BP: (!) 103/45 133/70  Pulse: 62 66  Resp: 19 20  Temp: 98 F (36.7 C) 97.7 F (36.5 C)  SpO2: 99% 98%    Intake/Output Summary (Last 24 hours) at 06/02/2023 1108 Last data filed at 06/01/2023 1737 Gross per 24 hour  Intake 418 ml  Output --  Net 418 ml   Last Weight  Most recent update: 05/30/2023  1:47 PM    Weight  54 kg (119 lb)            Gen:  Frail elderly caucasian M in NAD HEENT: moist mucous membranes CV: Regular rate and rhythm  PULM: On 4LPM [place], breathing nonlabored ABD: soft/nontender/  EXT: No edema  Neuro: Alert and oriented   PPS: 50%   This conversation/these recommendations were discussed with patient primary care team, Dr. Jerral Ralph  Billing based on MDM: High  Problems Addressed: One acute or chronic illness or injury that poses a  threat to life or bodily function  Amount and/or Complexity of Data: Category 3:Discussion of management or test interpretation with external physician/other qualified health care professional/appropriate source (not separately reported)  Risks: Decision not to resuscitate or to de-escalate care because of poor prognosis ______________________________________________________ [name] [place] Palliative Medicine Team Team Cell Phone: [phone] Please utilize secure chat with additional questions, if there is no response within 30 minutes please call the above phone number  Palliative Medicine Team providers are available by phone from 7am to 7pm daily and can be reached through the team cell phone.  Should this patient require assistance outside of these hours, please call the patient's attending physician.

## 2023-06-02 NOTE — Care Management Important Message (Signed)
Important Message  Patient Details  Name: Brent Brennan MRN: [id number] Date of Birth: Feb 07, 1931   Medicare Important Message Given:  Yes     Dorena Bodo 06/02/2023, 4:29 PM

## 2023-06-02 NOTE — Plan of Care (Signed)

## 2023-06-02 NOTE — Progress Notes (Addendum)
PROGRESS NOTE        PATIENT DETAILS Name: Brent Brennan Age: 89 y.o. Sex: male Date of Birth: December 24, 1930 Admit Date: 05/30/2023 Admitting Physician Dewayne Shorter Levora Dredge, MD WJX:BJYN, Ravisankar, MD  Brief Summary: Patient is a 89 y.o.  male prior history of Bell's palsy, HTN, dementia, depression, GERD-who apparently had a "bout" of PNA in June-and was treated as an outpatient with oral antimicrobial therapy-presented with cough-upon further evaluation-he was found to have fever and acute hypoxic respiratory failure in the setting of multifocal pneumonia.  Patient was started on heated high flow oxygen in the ED and subsequently admitted to the hospitalist service.  Significant events: 8/16>> admit to [facility]-hypoxia-heated high flow  Significant studies: 8/16>> CXR: Bilateral pulmonary opacities 8/17>> HRCT chest: Multilobar bronchopneumonia  Significant microbiology data: 8/16>> COVID/influenza/RSV PCR: Negative 8/16>> respiratory virus panel: Negative 8/16>> blood cultures: Negative  Procedures: None  Consults: PCCM  Subjective: Some bleeding last night-awake/alert this morning.  Down to 4-5 L of HFNC.  Objective: Vitals: Blood pressure 133/70, pulse 66, temperature 97.7 F (36.5 C), temperature source Oral, resp. rate 20, height 5\' 10"  (1.778 m), weight 54 kg, SpO2 98%.   Exam: Chest: Few bibasilar rales but much improved compared to the past few days Right facial weakness from prior Bell's palsy-otherwise nonfocal Abdomen: Soft/nontender  Extremity: No edema  Pertinent Labs/Radiology:    Latest Ref Rng & Units 06/02/2023    2:11 AM 05/31/2023    2:33 AM 05/30/2023    4:55 PM  CBC  WBC 4.0 - 10.5 K/uL 5.2  5.9  7.8   Hemoglobin 13.0 - 17.0 g/dL 82.9  56.2  13.0   Hematocrit 39.0 - 52.0 % 33.3  32.1  33.9   Platelets 150 - 400 K/uL 106  76  95     Lab Results  Component Value Date   NA 143 06/02/2023   K 3.1 (L) 06/02/2023   CL 102  06/02/2023   CO2 32 06/02/2023     Assessment/Plan: Acute hypoxic respiratory failure secondary to multifocal pneumonia Felt to be secondary to aspiration pneumonia (elderly/Bell's palsy with right facial droop/history of esophageal issues requiring dilatation) Rapidly improving now-Down to 4-5 L of FiO2 this morning Continue Unasyn/pulmonary tolerating May home O2 on discharge-will need to be assessed prior to discharge plans.  Multifactorial dysphagia In the setting of Bell's palsy/debility (probably oropharyngeal component) and also some esophageal component (history of solid food dysphagia/choking sensation-prior history of esophageal dilatation) Is n.p.o. SLP eval for MBS Barium esophagogram also ordered to evaluate esophagus now that his FiO2 needs are much better. Suspect he has had some chronic aspiration of the past several months-he also had a episode of pneumonia in June 2024 that was treated as an outpatient.  HTN Stable HCTZ on hold  Hypokalemia Replete/recheck  GERD/esophageal stricture PPI See above-has had esophageal dilatation by Dr. Mansouraty-May 2022  Dementia with delirium Some delirium overnight Currently awake/alert Namenda Delirium precautions.  Depression Lexapro  History of Bell's palsy with right-sided facial weakness  Palliative care Suspect has had chronic aspiration issues for a while-had pneumonia in June of this year as well-this was treated with antibiotics in the outpatient setting. Unfortunately-patient is elderly with known GI/neuromuscular issues (dementia/Bell's palsy)-and is at risk for decompensation from recurrent aspiration pneumonitis. After extensive discussion with family on 8/17-he is now a DNR Palliative care  to continue to engage family and have ongoing goals of care discussion.  Underweight: Estimated body mass index is 17.07 kg/m as calculated from the following:   Height as of this encounter: 5\' 10"  (1.778 m).    Weight as of this encounter: 54 kg.   Code status:   Code Status: DNR   DVT Prophylaxis: enoxaparin (LOVENOX) injection 40 mg Start: 05/30/23 1645   Family Communication: Daughter-Melissa at bedside on 8/18-none at bedside earlier this morning.   Disposition Plan: Status is: Inpatient Remains inpatient appropriate because: Severity of illness   Planned Discharge Destination:Home health   Diet: Diet Order             Diet NPO time specified  Diet effective now                     Antimicrobial agents: Anti-infectives (From admission, onward)    Start     Dose/Rate Route Frequency Ordered Stop   05/31/23 1200  Ampicillin-Sulbactam (UNASYN) 3 g in sodium chloride 0.9 % 100 mL IVPB        3 g 200 mL/hr over 30 Minutes Intravenous Every 8 hours 05/31/23 1107 06/05/23 1159   05/30/23 1645  cefTRIAXone (ROCEPHIN) 2 g in sodium chloride 0.9 % 100 mL IVPB  Status:  Discontinued        2 g 200 mL/hr over 30 Minutes Intravenous Every 24 hours 05/30/23 1644 05/31/23 1103   05/30/23 1345  azithromycin (ZITHROMAX) 500 mg in sodium chloride 0.9 % 250 mL IVPB  Status:  Discontinued        500 mg 250 mL/hr over 60 Minutes Intravenous Every 24 hours 05/30/23 1337 05/31/23 1103        MEDICATIONS: Scheduled Meds:  vitamin C  1,000 mg Oral Daily   calcium carbonate  1 tablet Oral Q breakfast   cholecalciferol  2,000 Units Oral Daily   enoxaparin (LOVENOX) injection  40 mg Subcutaneous Q24H   escitalopram  10 mg Oral Daily   guaiFENesin  600 mg Oral BID   memantine  10 mg Oral BID   mirtazapine  15 mg Oral QHS   multivitamin with minerals  1 tablet Oral Daily   pantoprazole  40 mg Oral BID   Continuous Infusions:  ampicillin-sulbactam (UNASYN) IV 3 g (06/02/23 0347)   dextrose 5% lactated ringers 40 mL/hr at 06/01/23 1806   PRN Meds:.acetaminophen **OR** acetaminophen, haloperidol lactate, hydrALAZINE, levalbuterol, melatonin, ondansetron **OR** ondansetron (ZOFRAN) IV,  polyethylene glycol, traMADol   I have personally reviewed following labs and imaging studies  LABORATORY DATA: CBC: Recent Labs  Lab 05/30/23 1337 05/30/23 1655 05/31/23 0233 06/02/23 0211  WBC 10.2 7.8 5.9 5.2  NEUTROABS 8.9*  --   --   --   HGB 12.6* 10.8* 10.4* 10.5*  HCT 39.4 33.9* 32.1* 33.3*  MCV 94.3 94.7 92.2 95.1  PLT 118* 95* 76* 106*    Basic Metabolic Panel: Recent Labs  Lab 05/30/23 1337 05/30/23 1655 05/31/23 0233 06/01/23 0728 06/02/23 0211  NA 139  --  139 141 143  K 4.2  --  3.2* 3.1* 3.1*  CL 96*  --  98 99 102  CO2 30  --  32 33* 32  GLUCOSE 123*  --  116* 139* 118*  BUN 26*  --  20 21 18   CREATININE 1.00 0.91 0.83 0.84 0.69  CALCIUM 9.3  --  8.5* 8.4* 8.4*    GFR: Estimated Creatinine Clearance: 46.9 mL/min (by C-G  formula based on SCr of 0.69 mg/dL).  Liver Function Tests: Recent Labs  Lab 05/30/23 1337 05/31/23 0233  AST 30 17  ALT 10 13  ALKPHOS 63 46  BILITOT 2.3* 1.1  PROT 6.0* 4.7*  ALBUMIN 3.3* 2.4*   No results for input(s): "LIPASE", "AMYLASE" in the last 168 hours. No results for input(s): "AMMONIA" in the last 168 hours.  Coagulation Profile: Recent Labs  Lab 05/30/23 1337  INR 1.6*    Cardiac Enzymes: No results for input(s): "CKTOTAL", "CKMB", "CKMBINDEX", "TROPONINI" in the last 168 hours.  BNP (last 3 results) No results for input(s): "PROBNP" in the last 8760 hours.  Lipid Profile: No results for input(s): "CHOL", "HDL", "LDLCALC", "TRIG", "CHOLHDL", "LDLDIRECT" in the last 72 hours.  Thyroid Function Tests: No results for input(s): "TSH", "T4TOTAL", "FREET4", "T3FREE", "THYROIDAB" in the last 72 hours.  Anemia Panel: No results for input(s): "VITAMINB12", "FOLATE", "FERRITIN", "TIBC", "IRON", "RETICCTPCT" in the last 72 hours.  Urine analysis:    Component Value Date/Time   COLORURINE AMBER (A) 05/31/2023 0946   APPEARANCEUR CLEAR 05/31/2023 0946   LABSPEC 1.027 05/31/2023 0946   PHURINE 5.0  05/31/2023 0946   GLUCOSEU NEGATIVE 05/31/2023 0946   HGBUR NEGATIVE 05/31/2023 0946   BILIRUBINUR NEGATIVE 05/31/2023 0946   KETONESUR 20 (A) 05/31/2023 0946   PROTEINUR 30 (A) 05/31/2023 0946   NITRITE NEGATIVE 05/31/2023 0946   LEUKOCYTESUR NEGATIVE 05/31/2023 0946    Sepsis Labs: Lactic Acid, Venous    Component Value Date/Time   LATICACIDVEN 1.2 05/30/2023 1736    MICROBIOLOGY: Recent Results (from the past 240 hour(s))  Blood Culture (routine x 2)     Status: None (Preliminary result)   Collection Time: 05/30/23  1:37 PM   Specimen: BLOOD RIGHT FOREARM  Result Value Ref Range Status   Specimen Description BLOOD RIGHT FOREARM  Final   Special Requests   Final    BOTTLES DRAWN AEROBIC AND ANAEROBIC Blood Culture adequate volume   Culture   Final    NO GROWTH 3 DAYS Performed at [facility], [address], [place], [place] [address]    Report Status PENDING  Incomplete  Blood Culture (routine x 2)     Status: None (Preliminary result)   Collection Time: 05/30/23  1:42 PM   Specimen: BLOOD RIGHT ARM  Result Value Ref Range Status   Specimen Description BLOOD RIGHT ARM  Final   Special Requests   Final    BOTTLES DRAWN AEROBIC AND ANAEROBIC Blood Culture adequate volume   Culture   Final    NO GROWTH 3 DAYS Performed at [facility], [address], [place], [place] [address]    Report Status PENDING  Incomplete  Resp panel by RT-PCR (RSV, Flu A&B, Covid) Anterior Nasal Swab     Status: None   Collection Time: 05/30/23  3:18 PM   Specimen: Anterior Nasal Swab  Result Value Ref Range Status   SARS Coronavirus 2 by RT PCR NEGATIVE NEGATIVE Final   Influenza A by PCR NEGATIVE NEGATIVE Final   Influenza B by PCR NEGATIVE NEGATIVE Final    Comment: (NOTE) The Xpert Xpress SARS-CoV-2/FLU/RSV plus assay is intended as an aid in the diagnosis of influenza from Nasopharyngeal swab specimens and should not be used as a sole basis for treatment. Nasal washings  and aspirates are unacceptable for Xpert Xpress SARS-CoV-2/FLU/RSV testing.  Fact Sheet for Patients: BloggerCourse.com  Fact Sheet for Healthcare Providers: SeriousBroker.it  This test is not  yet approved or cleared by the [place] and has been authorized for detection and/or diagnosis of SARS-CoV-2 by FDA under an Emergency Use Authorization (EUA). This EUA will remain in effect (meaning this test can be used) for the duration of the COVID-19 declaration under Section 564(b)(1) of the Act, 21 U.S.C. section 360bbb-3(b)(1), unless the authorization is terminated or revoked.     Resp Syncytial Virus by PCR NEGATIVE NEGATIVE Final    Comment: (NOTE) Fact Sheet for Patients: BloggerCourse.com  Fact Sheet for Healthcare Providers: SeriousBroker.it  This test is not yet approved or cleared by the [place] FDA and has been authorized for detection and/or diagnosis of SARS-CoV-2 by FDA under an Emergency Use Authorization (EUA). This EUA will remain in effect (meaning this test can be used) for the duration of the COVID-19 declaration under Section 564(b)(1) of the Act, 21 U.S.C. section 360bbb-3(b)(1), unless the authorization is terminated or revoked.  Performed at [facility], [address], [place], [place] [address]   Respiratory (~20 pathogens) panel by PCR     Status: None   Collection Time: 05/30/23  3:39 PM   Specimen: Nasopharyngeal Swab; Respiratory  Result Value Ref Range Status   Adenovirus NOT DETECTED NOT DETECTED Final   Coronavirus 229E NOT DETECTED NOT DETECTED Final    Comment: (NOTE) The Coronavirus on the Respiratory Panel, DOES NOT test for the novel  Coronavirus (2019 nCoV)    Coronavirus HKU1 NOT DETECTED NOT DETECTED Final   Coronavirus NL63 NOT DETECTED NOT DETECTED Final   Coronavirus OC43 NOT DETECTED NOT DETECTED Final    Metapneumovirus NOT DETECTED NOT DETECTED Final   Rhinovirus / Enterovirus NOT DETECTED NOT DETECTED Final   Influenza A NOT DETECTED NOT DETECTED Final   Influenza B NOT DETECTED NOT DETECTED Final   Parainfluenza Virus 1 NOT DETECTED NOT DETECTED Final   Parainfluenza Virus 2 NOT DETECTED NOT DETECTED Final   Parainfluenza Virus 3 NOT DETECTED NOT DETECTED Final   Parainfluenza Virus 4 NOT DETECTED NOT DETECTED Final   Respiratory Syncytial Virus NOT DETECTED NOT DETECTED Final   Bordetella pertussis NOT DETECTED NOT DETECTED Final   Bordetella Parapertussis NOT DETECTED NOT DETECTED Final   Chlamydophila pneumoniae NOT DETECTED NOT DETECTED Final   Mycoplasma pneumoniae NOT DETECTED NOT DETECTED Final    Comment: Performed at [facility], [address] [place], [place] [address]    RADIOLOGY STUDIES/RESULTS: CT Chest High Resolution  Result Date: 05/31/2023 CLINICAL DATA:  Inpatient. Dyspnea and cough. Respiratory illness. Abnormal chest radiograph. EXAM: CT CHEST WITHOUT CONTRAST TECHNIQUE: Multidetector CT imaging of the chest was performed following the standard protocol without intravenous contrast. High resolution imaging of the lungs, as well as inspiratory and expiratory imaging, was performed. RADIATION DOSE REDUCTION: This exam was performed according to the departmental dose-optimization program which includes automated exposure control, adjustment of the mA and/or kV according to patient size and/or use of iterative reconstruction technique. COMPARISON:  Chest radiograph from earlier today. 06/05/2010 chest CT. FINDINGS: Cardiovascular: Normal heart size. No significant pericardial effusion/thickening. Three-vessel coronary atherosclerosis. Atherosclerotic nonaneurysmal thoracic aorta. Normal caliber pulmonary arteries. Mediastinum/Nodes: No significant thyroid nodules. Unremarkable esophagus. No pathologically enlarged axillary, mediastinal or hilar lymph nodes, noting  limited sensitivity for the detection of hilar adenopathy on this noncontrast study. Lungs/Pleura: No pneumothorax. Small layering bilateral pleural effusions. Mild layering patchy mucoid debris in the right mainstem bronchus and bronchus intermedius. Extensive patchy peribronchovascular consolidation (occasionally vaguely nodular as in the  right middle lobe on series 6/image 126) and ground-glass opacity throughout both lungs with scattered regions of prominent centrilobular ground-glass micronodularity in the lungs. Bandlike consolidation with air bronchograms and some volume loss in the posterior lower lobes bilaterally and in the inferior lingula. Mild cylindrical bronchiectasis in the inferior lingula. No significant regions of subpleural reticulation or frank honeycombing. No significant lobular air trapping or evidence of tracheobronchomalacia on the expiration sequence. No discrete lung masses. No central airway stenoses. Upper abdomen: Chronic prominent elevation of the left hemidiaphragm. Cholecystectomy. Numerous simple bilateral upper renal cysts, largest 6.7 cm on the right, for which no follow-up imaging is recommended. Marked left colonic diverticulosis. Musculoskeletal: No aggressive appearing focal osseous lesions. Moderate superior T12 vertebral compression fracture of uncertain chronicity, new since 06/05/2010 chest CT, favor subacute. Moderate thoracic spondylosis. IMPRESSION: 1. Multilobar bronchopneumonia. Extensive patchy peribronchovascular consolidation and ground-glass opacity throughout both lungs with scattered regions of prominent centrilobular ground-glass micronodularity. Bandlike consolidation with air bronchograms and some volume loss in the posterior lower lobes bilaterally and in the inferior lingula. 2. Mild cylindrical bronchiectasis in the inferior lingula. No findings to suggest underlying interstitial lung disease. 3. Small layering bilateral pleural effusions. 4. Chronic  prominent elevation of the left hemidiaphragm. 5. Three-vessel coronary atherosclerosis. 6. Marked left colonic diverticulosis. 7. Moderate superior T12 vertebral compression fracture of uncertain chronicity, new since 06/05/2010 chest CT, favor subacute, correlate with injury history and directed clinical exam. 8.  Aortic Atherosclerosis (ICD10-I70.0). Electronically Signed   By: Delbert Phenix M.D.   On: 05/31/2023 16:23     LOS: 3 days   Jeoffrey Massed, MD  [facility]    To contact the attending provider between 7A-7P or the covering provider during after hours 7P-7A, please log into the web site [email or web address] and access using universal [place] password for that web site. If you do not have the password, please call the hospital operator.  06/02/2023, 10:17 AM

## 2023-06-02 NOTE — Progress Notes (Addendum)
Speech Language Pathology Treatment: Dysphagia  Patient Details Name: Brent Brennan MRN: [id number] DOB: [birth year] Today's Date: 06/02/2023 Time: 4098-1191 SLP Time Calculation (min) (ACUTE ONLY): 12 min  Assessment / Plan / Recommendation Clinical Impression  Per RN, pt recently received thorough oral care. RN reports plan to wean pt from 6L HFNC today to [place] as SpO2 has consistently remained at 100%. SLP observed pt with trials of thin liquids which resulted in an immediate cough and throat clear x2. Trials of purees with no overt s/s of dysphagia and no observed coughing, although note one delayed throat clear after completion of all PO trials. Esophagram completed today with noted aspiration of thin barium. Given pt's improving respiratory status and tentative plan to reduce amount of supplemental O2, feel pt is appropriate for an instrumental swallow study once he no longer requires HFNC. Recommend he remain NPO pending results of MBS. Will plan to complete MBS as scheduling in radiology allows.    HPI HPI: Brent Brennan is a 89 yo male presenting to ED 8/16 with increased disorientation and a dry cough. Presented to PCP with hypoxia and CXR performed there revealed PNA and was sent to ED. Per note, pt had O2 saturations in the low 80s upon presentation to ED and was placed on a NRB with continued desats and was then started on HHFNC. CXR 8/17 with increased bilateral pulmonary opacities compatible with multifocal PNA. Found to be febrile with acute hypoxic respiratory failure. EGD completed 8/19 with aspiration of thin barium and SLP evaluation recommended. 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      SLP Plan  MBS      Recommendations for follow up therapy are one component of a multi-disciplinary discharge planning process, led by the attending physician.  Recommendations may be updated based on patient status, additional functional criteria  and insurance authorization.    Recommendations  Diet recommendations: NPO Medication Administration: Via alternative means                  Oral care QID   Frequent or constant Supervision/Assistance Dysphagia, unspecified (R13.10)     MBS     Gwynneth Aliment, M.A., CF-SLP Speech Language Pathology, Acute Rehabilitation Services  Secure Chat preferred [phone]   06/02/2023, 1:05 PM

## 2023-06-02 NOTE — Plan of Care (Signed)

## 2023-06-03 ENCOUNTER — Inpatient Hospital Stay (HOSPITAL_COMMUNITY): Payer: Medicare Other

## 2023-06-03 DIAGNOSIS — R1313 Dysphagia, pharyngeal phase: Secondary | ICD-10-CM

## 2023-06-03 DIAGNOSIS — Z515 Encounter for palliative care: Secondary | ICD-10-CM | POA: Diagnosis not present

## 2023-06-03 DIAGNOSIS — I159 Secondary hypertension, unspecified: Secondary | ICD-10-CM | POA: Diagnosis not present

## 2023-06-03 DIAGNOSIS — J9601 Acute respiratory failure with hypoxia: Secondary | ICD-10-CM | POA: Diagnosis not present

## 2023-06-03 DIAGNOSIS — K219 Gastro-esophageal reflux disease without esophagitis: Secondary | ICD-10-CM | POA: Diagnosis not present

## 2023-06-03 DIAGNOSIS — F0390 Unspecified dementia without behavioral disturbance: Secondary | ICD-10-CM | POA: Diagnosis not present

## 2023-06-03 LAB — LEGIONELLA PNEUMOPHILA SEROGP 1 UR AG: L. pneumophila Serogp 1 Ur Ag: NEGATIVE

## 2023-06-03 LAB — BASIC METABOLIC PANEL
Anion gap: 10 (ref 5–15)
BUN: 14 mg/dL (ref 8–23)
CO2: 32 mmol/L (ref 22–32)
Calcium: 8.5 mg/dL — ABNORMAL LOW (ref 8.9–10.3)
Chloride: 102 mmol/L (ref 98–111)
Creatinine, Ser: 0.87 mg/dL (ref 0.61–1.24)
GFR, Estimated: >60 mL/min (ref >60)
Glucose, Bld: 115 mg/dL — ABNORMAL HIGH (ref 70–99)
Potassium: 3.2 mmol/L — ABNORMAL LOW (ref 3.5–5.1)
Sodium: 144 mmol/L (ref 135–145)

## 2023-06-03 LAB — MAGNESIUM: Magnesium: 1.9 mg/dL (ref 1.7–2.4)

## 2023-06-03 MED ORDER — DEXTROSE-SODIUM CHLORIDE 5-0.9 % IV SOLN: INTRAVENOUS | Status: AC

## 2023-06-03 MED ORDER — POTASSIUM CHLORIDE 10 MEQ/100ML IV SOLN
10.0000 meq | INTRAVENOUS | Status: AC
  Administered 2023-06-03 (×2): 10 meq via INTRAVENOUS
  Filled 2023-06-03 (×3): qty 100

## 2023-06-03 MED ORDER — MELATONIN 5 MG PO TABS: 5.0000 mg | ORAL_TABLET | Freq: Every day | ORAL | Status: DC

## 2023-06-03 MED ORDER — POTASSIUM CHLORIDE 10 MEQ/100ML IV SOLN
10.0000 meq | Freq: Once | INTRAVENOUS | Status: AC
  Administered 2023-06-03: 10 meq via INTRAVENOUS

## 2023-06-03 NOTE — Progress Notes (Signed)
Occupational Therapy Treatment Patient Details Name: Brent Brennan MRN: [id number] DOB: 12-Jul-1931 Today's Date: 06/03/2023   History of present illness 89 yo male presenting to ED 8/16 with increased disorientation and a dry cough. +pna, acute respiratory failure  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   OT comments  Pt making progress with functional goals. Pt's O2 SATs 94% at rest upon arrival, dropping to 87-89% during in room activity to bathroom and sink with CGA for ADL mobility assist. OT will continue to follow acutely to maximize level of function and safety      If plan is discharge home, recommend the following:  A little help with walking and/or transfers;A little help with bathing/dressing/bathroom;Assistance with cooking/housework;Direct supervision/assist for medications management;Direct supervision/assist for financial management;Assist for transportation;Help with stairs or ramp for entrance   Equipment Recommendations  None recommended by OT    Recommendations for Other Services      Precautions / Restrictions Precautions Precautions: Fall;Other (comment) Precaution Comments: monitor sats closely Restrictions Weight Bearing Restrictions: No       Mobility Bed Mobility Overal bed mobility: Needs Assistance Bed Mobility: Supine to Sit, Sit to Supine     Supine to sit: Supervision, HOB elevated, Used rails Sit to supine: Supervision   General bed mobility comments: incr time and effort but no physical assist or cues needed    Transfers Overall transfer level: Needs assistance Equipment used: None Transfers: Sit to/from Stand Sit to Stand: Contact guard assist                 Balance Overall balance assessment: Mild deficits observed, not formally tested                                         ADL either performed or assessed with clinical judgement   ADL Overall ADL's : Needs  assistance/impaired     Grooming: Wash/dry hands;Wash/dry face;Contact guard assist;Standing           Upper Body Dressing : Set up;Supervision/safety;Sitting   Lower Body Dressing: Contact guard assist   Toilet Transfer: Contact guard assist;BSC/3in1;Cueing for safety   Toileting- Clothing Manipulation and Hygiene: Contact guard assist;Sit to/from stand       Functional mobility during ADLs: Contact guard assist;Cueing for safety      Extremity/Trunk Assessment Upper Extremity Assessment Upper Extremity Assessment: Generalized weakness   Lower Extremity Assessment Lower Extremity Assessment: Defer to PT evaluation   Cervical / Trunk Assessment Cervical / Trunk Assessment: Normal    Vision Baseline Vision/History: 4 Cataracts;0 No visual deficits;1 Wears glasses Ability to See in Adequate Light: 1 Impaired     Perception     Praxis      Cognition Arousal: Alert Behavior During Therapy: WFL for tasks assessed/performed Overall Cognitive Status: History of cognitive impairments - at baseline                                 General Comments: wearing bil mitts due to repeatedly pulling O2 off        Exercises      Shoulder Instructions       General Comments      Pertinent Vitals/ Pain       Pain Assessment Pain Assessment: No/denies pain  Home Living  Prior Functioning/Environment              Frequency  Min 1X/week        Progress Toward Goals  OT Goals(current goals can now be found in the care plan section)  Progress towards OT goals: Progressing toward goals     Plan      Co-evaluation                 AM-PAC OT "6 Clicks" Daily Activity     Outcome Measure   Help from another person eating meals?: None Help from another person taking care of personal grooming?: A Little Help from another person toileting, which includes using toliet, bedpan,  or urinal?: A Little Help from another person bathing (including washing, rinsing, drying)?: A Little Help from another person to put on and taking off regular upper body clothing?: A Little Help from another person to put on and taking off regular lower body clothing?: A Little 6 Click Score: 19    End of Session Equipment Utilized During Treatment: Gait belt  OT Visit Diagnosis: Muscle weakness (generalized) (M62.81)   Activity Tolerance Patient tolerated treatment well   Patient Left with call bell/phone within reach;in bed;with bed alarm set   Nurse Communication          Time: 7253-6644 OT Time Calculation (min): 18 min  Charges: OT General Charges $OT Visit: 1 Visit OT Treatments $Self Care/Home Management : 8-22 mins   Galen Manila 06/03/2023, 3:46 PM

## 2023-06-03 NOTE — Procedures (Addendum)
Modified Barium Swallow Study  Patient Details  Name: Brent Brennan MRN: [id number] Date of Birth: 03-Jan-1931  Today's Date: 06/03/2023  HPI/PMH: HPI: Brent Brennan is a 89 yo male presenting to ED 8/16 with increased disorientation and a dry cough. Presented to PCP with hypoxia and CXR performed there revealed PNA and was sent to ED. Per note, pt had O2 saturations in the low 80s upon presentation to ED and was placed on a NRB with continued desats and was then started on HHFNC. CXR 8/17 with increased bilateral pulmonary opacities compatible with multifocal PNA. Found to be febrile with acute hypoxic respiratory failure. EGD completed 8/19 with aspiration of thin barium and SLP evaluation recommended. 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   Clinical Impression: Clinical Impression: Patient presents with a profound pharyngeal phase dysphagia as per this MBS. Prior to any boluses given, barium contrast observed in nasopharynx and adhered to posterior pharyngeal wall, presumably from esophagram that was performed previous date  (06/02/23) at 1213. Patient's oral mucosa was very dry with secretions observed mainly on hard and soft palate and oropharynx. Cough was ineffective to clear any secretions or PO residuals. SLP administered one small teaspoon sip of thin liquid barium which resulted in penetration above the vocal cords (PAS 3). Patient did exhibit laryngeal elevation during swallow but no anterior hyoid movement and no epiglottic inversion. Tested bolus of barium as well as barium that was present prior to this MBS all remained in nasopharynx and pharynx. No PES opening observed and no barium transiting through PES. SLP recommending continue NPO status with prognosis to safely have PO's being guarded. Plan for ice chip trials as patient likely has significant amount of dried secretions in pharynx as well.  Factors that may increase risk of adverse  event in presence of aspiration [name] [employer] Clearance Coots 2021): Factors that may increase risk of adverse event in presence of aspiration [name] & Clearance Coots 2021): Poor general health and/or compromised immunity; Reduced cognitive function; Limited mobility; Weak cough; Inadequate oral hygiene; Frail or deconditioned   Recommendations/Plan: Swallowing Evaluation Recommendations Swallowing Evaluation Recommendations Recommendations: NPO Medication Administration: Via alternative means Recommended consults: Consider Palliative care    Treatment Plan Treatment Plan Treatment recommendations: Therapy as outlined in treatment plan below Follow-up recommendations: Follow physicians's recommendations for discharge plan and follow up therapies Functional status assessment: Patient has had a recent decline in their functional status and demonstrates the ability to make significant improvements in function in a reasonable and predictable amount of time. Treatment frequency: Min 2x/week Treatment duration: 1 week Interventions: Aspiration precaution training; Trials of upgraded texture/liquids     Recommendations Recommendations for follow up therapy are one component of a multi-disciplinary discharge planning process, led by the attending physician.  Recommendations may be updated based on patient status, additional functional criteria and insurance authorization.  Assessment: Orofacial Exam: Orofacial Exam Oral Cavity: Oral Hygiene: Xerostomia; Dried secretions Oral Cavity - Dentition: Missing dentition Orofacial Anatomy: WFL    Anatomy:  Anatomy: Other (Comment)   Boluses Administered: Boluses Administered Boluses Administered: Thin liquids (Level 0)     Oral Impairment Domain: Oral Impairment Domain Lip Closure: No labial escape Tongue control during bolus hold: Not tested Bolus transport/lingual motion: Slow tongue motion Oral residue: Trace residue lining oral  structures Location of oral residue : Palate Initiation of pharyngeal swallow : Valleculae     Pharyngeal Impairment Domain: Pharyngeal Impairment Domain Soft palate elevation: -- (barium from esophagram completed  06/02/23 remained in nasopharynx and adhered to posterior pharyngeal wall) Laryngeal elevation: Complete superior movement of thyroid cartilage with complete approximation of arytenoids to epiglottic petiole Anterior hyoid excursion: Partial anterior movement Epiglottic movement: No inversion Laryngeal vestibule closure: None, wide column air/contrast in laryngeal vestibule Pharyngeal stripping wave : Absent Pharyngeal contraction (A/P view only): N/A Pharyngoesophageal segment opening: No distension with total obstruction of flow Tongue base retraction: Narrow column of contrast or air between tongue base and PPW Pharyngeal residue: Minimal to no pharyngeal clearance Location of pharyngeal residue: Pharyngeal wall; Valleculae; Diffuse (>3 areas); Pyriform sinuses; Tongue base     Esophageal Impairment Domain: Esophageal Impairment Domain Esophageal clearance upright position: Minimal to no esophageal clearance    Pill: No data recorded  Penetration/Aspiration Scale Score: Penetration/Aspiration Scale Score 3.  Material enters airway, remains ABOVE vocal cords and not ejected out: Thin liquids (Level 0)    Compensatory Strategies: Compensatory Strategies Compensatory strategies: Yes Effortful swallow: Ineffective Ineffective Effortful Swallow: Thin liquid (Level 0) Liquid wash: Ineffective Ineffective Liquid Wash: Thin liquid (Level 0)       General Information: Caregiver present: No   Diet Prior to this Study: NPO    Temperature : Normal    Respiratory Status: WFL    Supplemental O2: None (Room air)    History of Recent Intubation: No   Behavior/Cognition: Alert; Cooperative; Pleasant mood  Self-Feeding Abilities: Dependent for  feeding  Baseline vocal quality/speech: Hypophonia/low volume  Volitional Cough: Able to elicit  Volitional Swallow: Able to elicit  Exam Limitations: Other (comment) (limited to one bolus of thin liquids due to no pharyngeal clearance)   Goal Planning: Prognosis for improved oropharyngeal function: Guarded  Barriers to Reach Goals: Cognitive deficits; Severity of deficits  No data recorded Patient/Family Stated Goal: swallow function severely impaired  Consulted and agree with results and recommendations: Pt unable/family or caregiver not available   Pain: Pain Assessment Pain Assessment: No/denies pain    End of Session: Start Time:SLP Start Time (ACUTE ONLY): 1120  Stop Time: SLP Stop Time (ACUTE ONLY): 1135  Time Calculation:SLP Time Calculation (min) (ACUTE ONLY): 15 min  Charges: SLP Evaluations $ SLP Speech Visit: 1 Visit  SLP Evaluations $MBS Swallow: 1 Procedure $Swallowing Treatment: 1 Procedure   SLP visit diagnosis: SLP Visit Diagnosis: Dysphagia, unspecified (R13.10)    Past Medical History:  Past Medical History:  Diagnosis Date   Accidental fall from ladder    resulting in multiple L rib fracturs, traumatic small left hemopneumothorax, left transverse process fracture at T4, T5, and T8 with four-day hospitalization coupled K. by mild from cytopenia and mild hyperglycemia   Arthritis    bilateral hands   Colon polyps    Dementia (HCC)    Diarrhea    IBS, Dr Kinnie Scales- suspect malabsorption/maldigestion due to lactose intolerance give the amount of milk products consumed   Diverticulitis    GERD (gastroesophageal reflux disease)    Gilbert's syndrome    Hiatal hernia    History of rib fracture 2015   fell from ladder   Hypertension    Left inguinal hernia 11/09/2018   Paralysis (HCC)    right facial paralysis   Past Surgical History:  Past Surgical History:  Procedure Laterality Date   ADJACENT TISSUE TRANSFER/TISSUE REARRANGEMENT  Right 07/08/2022   Procedure: ADJACENT TISSUE TRANSFER/LOCAL TISSUE REARRANGEMENT OF RIGHT FACE;  Surgeon: Janne Napoleon, MD;  Location: [facility] OR;  Service: Plastics;  Laterality: Right;  2 hours   CATARACT EXTRACTION, BILATERAL  CHOLECYSTECTOMY     COLONOSCOPY     esophagus stretch  02/13/2021   EYE SURGERY     HERNIA REPAIR     Right inguinal   INGUINAL HERNIA REPAIR Left 11/09/2018   Procedure: OPEN REPAIR LEFT INGUINAL HERNIA WITH MESH ERAS PATHWAY;  Surgeon: Claud Kelp, MD;  Location: [facility] OR;  Service: General;  Laterality: Left;  TAP BLOCK   TONSILLECTOMY     as a child   UPPER GASTROINTESTINAL ENDOSCOPY     UPPER GASTROINTESTINAL ENDOSCOPY  02/13/2021    Angela Nevin, MA, CCC-SLP Speech Therapy

## 2023-06-03 NOTE — Plan of Care (Signed)

## 2023-06-03 NOTE — Progress Notes (Signed)
   Palliative Medicine Inpatient Follow Up Note HPI: Patient is a 89 y.o.  male prior history of Bell's palsy, HTN, dementia, depression, GERD-who apparently had a "bout" of PNA in June-and was treated as an outpatient with oral antimicrobial therapy-presented with cough-upon further evaluation-he was found to have fever and acute hypoxic respiratory failure in the setting of multifocal pneumonia.  Patient was started on heated high flow oxygen in the ED and subsequently admitted to the hospitalist service.    Palliative care has been requested for additional goals of care conversations.  Today's Discussion 06/03/2023  *Please note that this is a verbal dictation therefore any spelling or grammatical errors are due to the "[employer] One" system interpretation.  Chart reviewed inclusive of vital signs, progress notes, laboratory results, and diagnostic images.   I met with Brent Brennan at bedside this morning. He is resting comfortably in NAD. He is more disoriented this morning though redirectable. We discussed his present health in the setting of his pneumonia and concern in regards to his swallow ability. Discussed additional speech therapy involvement for a more comprehensive swallow study.  Reviewed plan to meet with [name] and his family tomorrow once we have additional diagnostic information.   Questions and concerns addressed/Palliative Support Provided.   Objective Assessment: Vital Signs Vitals:   06/03/23 0000 06/03/23 0400  BP: 126/60 (!) 140/70  Pulse: 61 70  Resp: 20 19  Temp: 97.9 F (36.6 C) 98.1 F (36.7 C)  SpO2: 93%     Intake/Output Summary (Last 24 hours) at 06/03/2023 0753 Last data filed at 06/03/2023 0600 Gross per 24 hour  Intake --  Output 900 ml  Net -900 ml   Last Weight  Most recent update: 05/30/2023  1:47 PM    Weight  54 kg (119 lb)            Gen:  Frail elderly caucasian M in NAD HEENT: moist mucous membranes CV: Regular rate and rhythm   PULM: On 2LPM [place], breathing nonlabored ABD: soft/nontender  EXT: No edema  Neuro: Alert and oriented   SUMMARY OF RECOMMENDATIONS   DNAR/DNI   Continue current care allowing time for outcomes --> Plan for MBS today which will help guide future conversations   Plan for family meeting on Wednesday at 1300 ad patients other daughter returns from [place] then   Ongoing PMT support  Billing based on MDM: Moderate ______________________________________________________________________________________ [name] [name] Palliative Medicine Team Team Cell Phone: [phone] Please utilize secure chat with additional questions, if there is no response within 30 minutes please call the above phone number  Palliative Medicine Team providers are available by phone from 7am to 7pm daily and can be reached through the team cell phone.  Should this patient require assistance outside of these hours, please call the patient's attending physician.

## 2023-06-03 NOTE — Progress Notes (Signed)
PROGRESS NOTE        PATIENT DETAILS Name: Brent Brennan Age: 89 y.o. Sex: male Date of Birth: Feb 27, 1931 Admit Date: 05/30/2023 Admitting Physician Dewayne Shorter Levora Dredge, MD ZOX:WRUE, Ravisankar, MD  Brief Summary: Patient is a 89 y.o.  male prior history of Bell's palsy, HTN, dementia, depression, GERD-who apparently had a "bout" of PNA in June-and was treated as an outpatient with oral antimicrobial therapy-presented with cough-upon further evaluation-he was found to have fever and acute hypoxic respiratory failure in the setting of multifocal pneumonia.  Patient was started on heated high flow oxygen in the ED and subsequently admitted to the hospitalist service.  Significant events: 8/16>> admit to [facility]-hypoxia-heated high flow  Significant studies: 8/16>> CXR: Bilateral pulmonary opacities 8/17>> HRCT chest: Multilobar bronchopneumonia 8/19>> barium esophagogram: Aspiration of thin barium with initial swallow for esophagogram-study aborted.  Significant microbiology data: 8/16>> COVID/influenza/RSV PCR: Negative 8/16>> respiratory virus panel: Negative 8/16>> blood cultures: Negative  Procedures: None  Consults: PCCM  Subjective: Improved-some confusion overnight.  Down to 2 L of oxygen.  Objective: Vitals: Blood pressure (!) 154/75, pulse (!) 58, temperature (!) 97.4 F (36.3 C), temperature source Oral, resp. rate (!) 25, height 5\' 10"  (1.778 m), weight 54 kg, SpO2 93%.   Exam: Mildly confused but easily reoriented. Chest clear to auscultation CVS: S1-S2 regular Extremity: No edema Chronic right-sided facial weakness but nonfocal  Pertinent Labs/Radiology:    Latest Ref Rng & Units 06/02/2023    2:11 AM 05/31/2023    2:33 AM 05/30/2023    4:55 PM  CBC  WBC 4.0 - 10.5 K/uL 5.2  5.9  7.8   Hemoglobin 13.0 - 17.0 g/dL 45.4  09.8  11.9   Hematocrit 39.0 - 52.0 % 33.3  32.1  33.9   Platelets 150 - 400 K/uL 106  76  95     Lab  Results  Component Value Date   NA 144 06/03/2023   K 3.2 (L) 06/03/2023   CL 102 06/03/2023   CO2 32 06/03/2023     Assessment/Plan: Acute hypoxic respiratory failure secondary to multifocal pneumonia Felt to be secondary to aspiration pneumonia (elderly/Bell's palsy with right facial droop/history of esophageal issues requiring dilatation) Rapidly improving-Down to 2 L of oxygen Unasyn Pulmonary tolerating Ambulatory O2 sat SLP evaluation ongoing for MBS today.    Multifactorial dysphagia In the setting of Bell's palsy/debility (probably oropharyngeal component) and also some esophageal component (history of solid food dysphagia/choking sensation-prior history of esophageal dilatation) Remains nothing by mouth Barium esophagogram aborted yesterday due to aspiration For modified barium swallow today Await further recommendations from SLP-this will likely dictate whether he is going to require hospice versus PEG tube situation.  Palliative care following. Suspect he has had some chronic aspiration of the past several months-he also had a episode of pneumonia in June 2024 that was treated as an outpatient.  HTN Stable HCTZ on hold  Hypokalemia Replete/recheck  GERD/esophageal stricture PPI See above-has had esophageal dilatation by Dr. Mansouraty-May 2022  Dementia with delirium Some delirium overnight Currently awake/alert Namenda Scheduled melatonin As needed Haldol Delirium precautions.  Depression Lexapro  History of Bell's palsy with right-sided facial weakness  Palliative care Suspect has had chronic aspiration issues for a while-had pneumonia in June of this year as well-this was treated with antibiotics in the outpatient setting. Unfortunately-patient is elderly with known  GI/neuromuscular issues (dementia/Bell's palsy)-and is at risk for decompensation from recurrent aspiration pneumonitis. After extensive discussion with family on 8/17-he is now a DNR SLP  following-this will likely dictate whether he is safe enough to eat orally or will need either hospice or PEG tube.  Palliative care following.  Underweight: Estimated body mass index is 17.07 kg/m as calculated from the following:   Height as of this encounter: 5\' 10"  (1.778 m).   Weight as of this encounter: 54 kg.   Code status:   Code Status: DNR   DVT Prophylaxis: enoxaparin (LOVENOX) injection 40 mg Start: 05/30/23 1645   Family Communication: Daughter-Melissa at bedside on 8/18-none at bedside earlier this morning.   Disposition Plan: Status is: Inpatient Remains inpatient appropriate because: Severity of illness   Planned Discharge Destination:Home health   Diet: Diet Order             Diet NPO time specified  Diet effective now                     Antimicrobial agents: Anti-infectives (From admission, onward)    Start     Dose/Rate Route Frequency Ordered Stop   05/31/23 1200  Ampicillin-Sulbactam (UNASYN) 3 g in sodium chloride 0.9 % 100 mL IVPB        3 g 200 mL/hr over 30 Minutes Intravenous Every 8 hours 05/31/23 1107 06/05/23 1159   05/30/23 1645  cefTRIAXone (ROCEPHIN) 2 g in sodium chloride 0.9 % 100 mL IVPB  Status:  Discontinued        2 g 200 mL/hr over 30 Minutes Intravenous Every 24 hours 05/30/23 1644 05/31/23 1103   05/30/23 1345  azithromycin (ZITHROMAX) 500 mg in sodium chloride 0.9 % 250 mL IVPB  Status:  Discontinued        500 mg 250 mL/hr over 60 Minutes Intravenous Every 24 hours 05/30/23 1337 05/31/23 1103        MEDICATIONS: Scheduled Meds:  vitamin C  1,000 mg Oral Daily   calcium carbonate  1 tablet Oral Q breakfast   cholecalciferol  2,000 Units Oral Daily   enoxaparin (LOVENOX) injection  40 mg Subcutaneous Q24H   escitalopram  10 mg Oral Daily   guaiFENesin  600 mg Oral BID   melatonin  5 mg Oral QHS   memantine  10 mg Oral BID   mirtazapine  15 mg Oral QHS   multivitamin with minerals  1 tablet Oral Daily    pantoprazole  40 mg Oral BID   Continuous Infusions:  ampicillin-sulbactam (UNASYN) IV 3 g (06/03/23 0411)   dextrose 5% lactated ringers 40 mL/hr at 06/01/23 1806   dextrose 5 % and 0.9 % NaCl 50 mL/hr at 06/03/23 0912   potassium chloride     PRN Meds:.acetaminophen **OR** acetaminophen, haloperidol lactate, hydrALAZINE, levalbuterol, ondansetron **OR** ondansetron (ZOFRAN) IV, polyethylene glycol, traMADol   I have personally reviewed following labs and imaging studies  LABORATORY DATA: CBC: Recent Labs  Lab 05/30/23 1337 05/30/23 1655 05/31/23 0233 06/02/23 0211  WBC 10.2 7.8 5.9 5.2  NEUTROABS 8.9*  --   --   --   HGB 12.6* 10.8* 10.4* 10.5*  HCT 39.4 33.9* 32.1* 33.3*  MCV 94.3 94.7 92.2 95.1  PLT 118* 95* 76* 106*    Basic Metabolic Panel: Recent Labs  Lab 05/30/23 1337 05/30/23 1655 05/31/23 0233 06/01/23 0728 06/02/23 0211 06/03/23 0154  NA 139  --  139 141 143 144  K 4.2  --  3.2* 3.1* 3.1* 3.2*  CL 96*  --  98 99 102 102  CO2 30  --  32 33* 32 32  GLUCOSE 123*  --  116* 139* 118* 115*  BUN 26*  --  20 21 18 14   CREATININE 1.00 0.91 0.83 0.84 0.69 0.87  CALCIUM 9.3  --  8.5* 8.4* 8.4* 8.5*  MG  --   --   --   --   --  1.9    GFR: Estimated Creatinine Clearance: 43.1 mL/min (by C-G formula based on SCr of 0.87 mg/dL).  Liver Function Tests: Recent Labs  Lab 05/30/23 1337 05/31/23 0233  AST 30 17  ALT 10 13  ALKPHOS 63 46  BILITOT 2.3* 1.1  PROT 6.0* 4.7*  ALBUMIN 3.3* 2.4*   No results for input(s): "LIPASE", "AMYLASE" in the last 168 hours. No results for input(s): "AMMONIA" in the last 168 hours.  Coagulation Profile: Recent Labs  Lab 05/30/23 1337  INR 1.6*    Cardiac Enzymes: No results for input(s): "CKTOTAL", "CKMB", "CKMBINDEX", "TROPONINI" in the last 168 hours.  BNP (last 3 results) No results for input(s): "PROBNP" in the last 8760 hours.  Lipid Profile: No results for input(s): "CHOL", "HDL", "LDLCALC", "TRIG",  "CHOLHDL", "LDLDIRECT" in the last 72 hours.  Thyroid Function Tests: No results for input(s): "TSH", "T4TOTAL", "FREET4", "T3FREE", "THYROIDAB" in the last 72 hours.  Anemia Panel: No results for input(s): "VITAMINB12", "FOLATE", "FERRITIN", "TIBC", "IRON", "RETICCTPCT" in the last 72 hours.  Urine analysis:    Component Value Date/Time   COLORURINE AMBER (A) 05/31/2023 0946   APPEARANCEUR CLEAR 05/31/2023 0946   LABSPEC 1.027 05/31/2023 0946   PHURINE 5.0 05/31/2023 0946   GLUCOSEU NEGATIVE 05/31/2023 0946   HGBUR NEGATIVE 05/31/2023 0946   BILIRUBINUR NEGATIVE 05/31/2023 0946   KETONESUR 20 (A) 05/31/2023 0946   PROTEINUR 30 (A) 05/31/2023 0946   NITRITE NEGATIVE 05/31/2023 0946   LEUKOCYTESUR NEGATIVE 05/31/2023 0946    Sepsis Labs: Lactic Acid, Venous    Component Value Date/Time   LATICACIDVEN 1.2 05/30/2023 1736    MICROBIOLOGY: Recent Results (from the past 240 hour(s))  Blood Culture (routine x 2)     Status: None (Preliminary result)   Collection Time: 05/30/23  1:37 PM   Specimen: BLOOD RIGHT FOREARM  Result Value Ref Range Status   Specimen Description BLOOD RIGHT FOREARM  Final   Special Requests   Final    BOTTLES DRAWN AEROBIC AND ANAEROBIC Blood Culture adequate volume   Culture   Final    NO GROWTH 4 DAYS Performed at [facility], [address], [place], [place] [address]    Report Status PENDING  Incomplete  Blood Culture (routine x 2)     Status: None (Preliminary result)   Collection Time: 05/30/23  1:42 PM   Specimen: BLOOD RIGHT ARM  Result Value Ref Range Status   Specimen Description BLOOD RIGHT ARM  Final   Special Requests   Final    BOTTLES DRAWN AEROBIC AND ANAEROBIC Blood Culture adequate volume   Culture   Final    NO GROWTH 4 DAYS Performed at [facility], [address], [place], [place] [address]    Report Status PENDING  Incomplete  Resp panel by RT-PCR (RSV, Flu A&B, Covid) Anterior Nasal Swab     Status: None    Collection Time: 05/30/23  3:18 PM   Specimen: Anterior Nasal Swab  Result Value Ref Range Status   SARS  Coronavirus 2 by RT PCR NEGATIVE NEGATIVE Final   Influenza A by PCR NEGATIVE NEGATIVE Final   Influenza B by PCR NEGATIVE NEGATIVE Final    Comment: (NOTE) The Xpert Xpress SARS-CoV-2/FLU/RSV plus assay is intended as an aid in the diagnosis of influenza from Nasopharyngeal swab specimens and should not be used as a sole basis for treatment. Nasal washings and aspirates are unacceptable for Xpert Xpress SARS-CoV-2/FLU/RSV testing.  Fact Sheet for Patients: BloggerCourse.com  Fact Sheet for Healthcare Providers: SeriousBroker.it  This test is not yet approved or cleared by the [place] FDA and has been authorized for detection and/or diagnosis of SARS-CoV-2 by FDA under an Emergency Use Authorization (EUA). This EUA will remain in effect (meaning this test can be used) for the duration of the COVID-19 declaration under Section 564(b)(1) of the Act, 21 U.S.C. section 360bbb-3(b)(1), unless the authorization is terminated or revoked.     Resp Syncytial Virus by PCR NEGATIVE NEGATIVE Final    Comment: (NOTE) Fact Sheet for Patients: BloggerCourse.com  Fact Sheet for Healthcare Providers: SeriousBroker.it  This test is not yet approved or cleared by the [place] FDA and has been authorized for detection and/or diagnosis of SARS-CoV-2 by FDA under an Emergency Use Authorization (EUA). This EUA will remain in effect (meaning this test can be used) for the duration of the COVID-19 declaration under Section 564(b)(1) of the Act, 21 U.S.C. section 360bbb-3(b)(1), unless the authorization is terminated or revoked.  Performed at [facility], [address], [place], [place] [address]   Respiratory (~20 pathogens) panel by PCR     Status: None   Collection Time:  05/30/23  3:39 PM   Specimen: Nasopharyngeal Swab; Respiratory  Result Value Ref Range Status   Adenovirus NOT DETECTED NOT DETECTED Final   Coronavirus 229E NOT DETECTED NOT DETECTED Final    Comment: (NOTE) The Coronavirus on the Respiratory Panel, DOES NOT test for the novel  Coronavirus (2019 nCoV)    Coronavirus HKU1 NOT DETECTED NOT DETECTED Final   Coronavirus NL63 NOT DETECTED NOT DETECTED Final   Coronavirus OC43 NOT DETECTED NOT DETECTED Final   Metapneumovirus NOT DETECTED NOT DETECTED Final   Rhinovirus / Enterovirus NOT DETECTED NOT DETECTED Final   Influenza A NOT DETECTED NOT DETECTED Final   Influenza B NOT DETECTED NOT DETECTED Final   Parainfluenza Virus 1 NOT DETECTED NOT DETECTED Final   Parainfluenza Virus 2 NOT DETECTED NOT DETECTED Final   Parainfluenza Virus 3 NOT DETECTED NOT DETECTED Final   Parainfluenza Virus 4 NOT DETECTED NOT DETECTED Final   Respiratory Syncytial Virus NOT DETECTED NOT DETECTED Final   Bordetella pertussis NOT DETECTED NOT DETECTED Final   Bordetella Parapertussis NOT DETECTED NOT DETECTED Final   Chlamydophila pneumoniae NOT DETECTED NOT DETECTED Final   Mycoplasma pneumoniae NOT DETECTED NOT DETECTED Final    Comment: Performed at [facility], [address], [place], [place] [address]    RADIOLOGY STUDIES/RESULTS: DG ESOPHAGUS W SINGLE CM (SOL OR THIN BA)  Result Date: 06/02/2023 CLINICAL DATA:  Dysphagia. History of Bell's palsy. History of recurrent pneumonia. Concern for aspiration. Request for esophagram. EXAM: ESOPHAGUS/BARIUM SWALLOW/TABLET STUDY TECHNIQUE: Limited single contrast examination was performed using thin liquid barium. This exam was performed by Brayton El PA-C , and was supervised and interpreted by Dr. Mauri Reading Mir. FLUOROSCOPY: Radiation Exposure Index (as provided by the fluoroscopic device): 7.2 mGy Kerma COMPARISON:  None Available. FINDINGS: Swallowing: Patient could not drink through a straw.  Contrast had to be injected into patient's mouth via syringe. Poor swallowing cues and mechanism. Prompt aspiration into the proximal trachea with initial swallow. Also poor cough response. Procedure terminated due to aspiration. Esophagus: Limited evaluation of the esophagus. However small volume contrast does reach the stomach. IMPRESSION: 1. Aspiration of thin barium with initial swallow for esophagram. 2. Recommend formal speech therapy evaluation. Electronically Signed   By: Acquanetta Belling M.D.   On: 06/02/2023 12:13     LOS: 4 days   Jeoffrey Massed, MD  [facility]    To contact the attending provider between 7A-7P or the covering provider during after hours 7P-7A, please log into the web site [email or web address] and access using universal [place] password for that web site. If you do not have the password, please call the hospital operator.  06/03/2023, 10:30 AM

## 2023-06-03 NOTE — Progress Notes (Signed)
Physical Therapy Treatment Patient Details Name: Brent Brennan MRN: [id number] DOB: 1930-11-30 Today's Date: 06/03/2023   History of Present Illness 89 yo male presenting to ED 8/16 with increased disorientation and a dry cough. +pna, acute respiratory failure  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    PT Comments  Patient on RA on arrival with sats 95%. Able to ambulate without device with min assist and sats >=89%. Steady gait if focused on direction he is walking; with head turns with imbalance requiring min assist to prevent fall. Will reassess with RW to decr fall risk and continue to work on Location manager.  Returned to bed due to leaving for swallow study.    If plan is discharge home, recommend the following: A little help with walking and/or transfers;Assistance with cooking/housework;Assist for transportation;Help with stairs or ramp for entrance   Can travel by private vehicle        Equipment Recommendations  None recommended by PT    Recommendations for Other Services       Precautions / Restrictions Precautions Precautions: Fall;Other (comment) Precaution Comments: monitor sats closely Restrictions Weight Bearing Restrictions: No     Mobility  Bed Mobility Overal bed mobility: Needs Assistance Bed Mobility: Supine to Sit, Sit to Supine     Supine to sit: Supervision, HOB elevated, Used rails Sit to supine: Supervision   General bed mobility comments: incr time and effort but no physical assist or cues needed    Transfers Overall transfer level: Needs assistance Equipment used: None Transfers: Sit to/from Stand Sit to Stand: Contact guard assist                Ambulation/Gait Ambulation/Gait assistance: Min assist Gait Distance (Feet): 250 Feet Assistive device: None Gait Pattern/deviations: Step-through pattern, Decreased stride length, Drifts right/left   Gait velocity interpretation: >2.62  ft/sec, indicative of community ambulatory   General Gait Details: on RA with sats >=89%; steady except when looking left or right with +drift/stagger to either side   Stairs             Wheelchair Mobility     Tilt Bed    Modified Rankin (Stroke Patients Only)       Balance Overall balance assessment: Mild deficits observed, not formally tested                                          Cognition Arousal: Alert Behavior During Therapy: WFL for tasks assessed/performed Overall Cognitive Status: History of cognitive impairments - at baseline                                 General Comments: wearing bil mitts due to repeatedly pulling O2 off        Exercises      General Comments        Pertinent Vitals/Pain Pain Assessment Pain Assessment: No/denies pain    Home Living                          Prior Function            PT Goals (current goals can now be found in the care plan section) Acute Rehab PT Goals Patient Stated Goal: be able to walk and go home  Time For Goal Achievement: 06/14/23 Potential to Achieve Goals: Good Progress towards PT goals: Progressing toward goals    Frequency    Min 1X/week      PT Plan      Co-evaluation              AM-PAC PT "6 Clicks" Mobility   Outcome Measure  Help needed turning from your back to your side while in a flat bed without using bedrails?: None Help needed moving from lying on your back to sitting on the side of a flat bed without using bedrails?: A Little Help needed moving to and from a bed to a chair (including a wheelchair)?: A Little Help needed standing up from a chair using your arms (e.g., wheelchair or bedside chair)?: A Little Help needed to walk in hospital room?: A Little Help needed climbing 3-5 steps with a railing? : A Little 6 Click Score: 19    End of Session Equipment Utilized During Treatment: Gait belt Activity Tolerance:  Patient tolerated treatment well Patient left: with call bell/phone within reach;in bed;with bed alarm set Nurse Communication: Mobility status PT Visit Diagnosis: Difficulty in walking, not elsewhere classified (R26.2)     Time: 2956-2130 PT Time Calculation (min) (ACUTE ONLY): 15 min  Charges:    $Gait Training: 8-22 mins PT General Charges $$ ACUTE PT VISIT: 1 Visit                      Jerolyn Center, PT Acute Rehabilitation Services  Office [phone]    Zena Amos 06/03/2023, 10:23 AM

## 2023-06-04 DIAGNOSIS — I159 Secondary hypertension, unspecified: Secondary | ICD-10-CM | POA: Diagnosis not present

## 2023-06-04 DIAGNOSIS — Z7189 Other specified counseling: Secondary | ICD-10-CM | POA: Diagnosis not present

## 2023-06-04 DIAGNOSIS — K219 Gastro-esophageal reflux disease without esophagitis: Secondary | ICD-10-CM | POA: Diagnosis not present

## 2023-06-04 DIAGNOSIS — F0390 Unspecified dementia without behavioral disturbance: Secondary | ICD-10-CM | POA: Diagnosis not present

## 2023-06-04 DIAGNOSIS — J9601 Acute respiratory failure with hypoxia: Secondary | ICD-10-CM | POA: Diagnosis not present

## 2023-06-04 DIAGNOSIS — Z66 Do not resuscitate: Secondary | ICD-10-CM

## 2023-06-04 DIAGNOSIS — Z515 Encounter for palliative care: Secondary | ICD-10-CM | POA: Diagnosis not present

## 2023-06-04 LAB — CULTURE, BLOOD (ROUTINE X 2)
Culture: NO GROWTH
Culture: NO GROWTH
Special Requests: ADEQUATE
Special Requests: ADEQUATE

## 2023-06-04 LAB — BASIC METABOLIC PANEL
Anion gap: 10 (ref 5–15)
BUN: 10 mg/dL (ref 8–23)
CO2: 30 mmol/L (ref 22–32)
Calcium: 8.1 mg/dL — ABNORMAL LOW (ref 8.9–10.3)
Chloride: 102 mmol/L (ref 98–111)
Creatinine, Ser: 0.86 mg/dL (ref 0.61–1.24)
GFR, Estimated: >60 mL/min (ref >60)
Glucose, Bld: 102 mg/dL — ABNORMAL HIGH (ref 70–99)
Potassium: 3 mmol/L — ABNORMAL LOW (ref 3.5–5.1)
Sodium: 142 mmol/L (ref 135–145)

## 2023-06-04 LAB — MAGNESIUM: Magnesium: 1.8 mg/dL (ref 1.7–2.4)

## 2023-06-04 MED ORDER — GLYCOPYRROLATE 0.2 MG/ML IJ SOLN: 0.2000 mg | INTRAMUSCULAR | Status: DC | PRN

## 2023-06-04 MED ORDER — LORAZEPAM 2 MG/ML PO CONC: 1.0000 mg | ORAL | Status: AC | PRN

## 2023-06-04 MED ORDER — METOPROLOL TARTRATE 5 MG/5ML IV SOLN: 2.5000 mg | INTRAVENOUS | Status: DC | PRN

## 2023-06-04 MED ORDER — LORAZEPAM 1 MG PO TABS: 1.0000 mg | ORAL_TABLET | ORAL | Status: DC | PRN

## 2023-06-04 MED ORDER — LORAZEPAM 2 MG/ML PO CONC: 1.0000 mg | ORAL | Status: DC | PRN

## 2023-06-04 MED ORDER — MAGNESIUM SULFATE 2 GM/50ML IV SOLN
2.0000 g | Freq: Once | INTRAVENOUS | Status: AC
  Administered 2023-06-04: 2 g via INTRAVENOUS
  Filled 2023-06-04: qty 50

## 2023-06-04 MED ORDER — BIOTENE DRY MOUTH MT LIQD: 15.0000 mL | OROMUCOSAL | Status: DC | PRN

## 2023-06-04 MED ORDER — POLYVINYL ALCOHOL 1.4 % OP SOLN: 1.0000 [drp] | Freq: Four times a day (QID) | OPHTHALMIC | Status: DC | PRN

## 2023-06-04 MED ORDER — GLYCOPYRROLATE 1 MG PO TABS: 1.0000 mg | ORAL_TABLET | ORAL | Status: DC | PRN

## 2023-06-04 MED ORDER — GLYCOPYRROLATE 0.2 MG/ML IJ SOLN: 0.2000 mg | INTRAMUSCULAR | Status: AC | PRN

## 2023-06-04 MED ORDER — ACETAMINOPHEN 325 MG PO TABS: 650.0000 mg | ORAL_TABLET | Freq: Four times a day (QID) | ORAL | Status: AC | PRN

## 2023-06-04 MED ORDER — HYDROMORPHONE HCL 1 MG/ML IJ SOLN: 0.5000 mg | INTRAMUSCULAR | Status: DC | PRN

## 2023-06-04 MED ORDER — LORAZEPAM 2 MG/ML IJ SOLN: 1.0000 mg | INTRAMUSCULAR | Status: DC | PRN

## 2023-06-04 MED ORDER — POTASSIUM CHLORIDE 10 MEQ/100ML IV SOLN
10.0000 meq | INTRAVENOUS | Status: AC
  Administered 2023-06-04 (×3): 10 meq via INTRAVENOUS
  Filled 2023-06-04 (×3): qty 100

## 2023-06-04 MED ORDER — HYDROMORPHONE HCL 1 MG/ML IJ SOLN: 0.5000 mg | INTRAMUSCULAR | Status: AC | PRN

## 2023-06-04 NOTE — Progress Notes (Signed)
Speech Language Pathology Treatment: Dysphagia  Patient Details Name: Brent Brennan MRN: [id number] DOB: 1931/10/04 Today's Date: 06/04/2023 Time: 4098-1191 SLP Time Calculation (min) (ACUTE ONLY): 15 min  Assessment / Plan / Recommendation Clinical Impression  Pt seen today with his daughters at bedside, who verbalize understanding of MBS results. SLP provided thorough oral care, removing thick brown secretions from pt's hard and soft palate. Observed pt with minimal trials of ice chips with immediate coughing noted after each presentation. Provided education to pt's daughter regarding pt's swallow function with guarded prognosis for safe PO intake. Will continue to follow pending further GOC discussions.    HPI HPI: Brent Brennan is a 89 yo male presenting to ED 8/16 with increased disorientation and a dry cough. Presented to PCP with hypoxia and CXR performed there revealed PNA and was sent to ED. Per note, pt had O2 saturations in the low 80s upon presentation to ED and was placed on a NRB with continued desats and was then started on HHFNC. CXR 8/17 with increased bilateral pulmonary opacities compatible with multifocal PNA. Found to be febrile with acute hypoxic respiratory failure. EGD completed 8/19 with aspiration of thin barium and SLP evaluation recommended. 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      SLP Plan  Continue with current plan of care      Recommendations for follow up therapy are one component of a multi-disciplinary discharge planning process, led by the attending physician.  Recommendations may be updated based on patient status, additional functional criteria and insurance authorization.    Recommendations  Diet recommendations: NPO Medication Administration: Via alternative means                  Oral care QID;Staff/trained caregiver to provide oral care   Frequent or constant  Supervision/Assistance Dysphagia, unspecified (R13.10)     Continue with current plan of care     Gwynneth Aliment, M.A., CF-SLP Speech Language Pathology, Acute Rehabilitation Services  Secure Chat preferred [phone]   06/04/2023, 1:02 PM

## 2023-06-04 NOTE — Progress Notes (Signed)
Physical Therapy Treatment Patient Details Name: Brent Brennan MRN: [id number] DOB: Apr 14, 1931 Today's Date: 06/04/2023   History of Present Illness 89 yo male presenting to ED 8/16 with increased disorientation and a dry cough. +pna, acute respiratory failure  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    PT Comments  Patient needing to go to the restroom on arrival. [facility] without device to/from restroom with minguard assist. Pt agreed to ambulate in hallway, however wanted to use the RW (did not use 8/20). Patient without drift or staggering with use of RW. On RA with sats >=89% and no dyspnea.     If plan is discharge home, recommend the following: A little help with walking and/or transfers;Assistance with cooking/housework;Assist for transportation;Help with stairs or ramp for entrance   Can travel by private vehicle        Equipment Recommendations  None recommended by PT    Recommendations for Other Services       Precautions / Restrictions Precautions Precautions: Fall;Other (comment) Precaution Comments: monitor sats closely Restrictions Weight Bearing Restrictions: No     Mobility  Bed Mobility Overal bed mobility: Needs Assistance Bed Mobility: Supine to Sit     Supine to sit: Supervision, HOB elevated     General bed mobility comments: incr time and effort but no physical assist or cues needed    Transfers Overall transfer level: Needs assistance Equipment used: None Transfers: Sit to/from Stand Sit to Stand: Contact guard assist           General transfer comment: from bed and toilet    Ambulation/Gait Ambulation/Gait assistance: Contact guard assist Gait Distance (Feet): 120 Feet Assistive device: Rolling walker (2 wheels) Gait Pattern/deviations: Step-through pattern, Decreased stride length, Drifts right/left   Gait velocity interpretation: 1.31 - 2.62 ft/sec, indicative of limited community  ambulator   General Gait Details: pt requested use of RW;  steady with no drift; on RA with sats >=89%;   Stairs             Wheelchair Mobility     Tilt Bed    Modified Rankin (Stroke Patients Only)       Balance Overall balance assessment: Mild deficits observed, not formally tested                                          Cognition Arousal: Alert Behavior During Therapy: WFL for tasks assessed/performed Overall Cognitive Status: History of cognitive impairments - at baseline                                          Exercises      General Comments        Pertinent Vitals/Pain Pain Assessment Pain Assessment: No/denies pain    Home Living                          Prior Function            PT Goals (current goals can now be found in the care plan section) Acute Rehab PT Goals Patient Stated Goal: be able to walk and go home Time For Goal Achievement: 06/14/23 Potential to Achieve Goals: Good Progress towards PT goals: Progressing toward goals  Frequency    Min 1X/week      PT Plan      Co-evaluation              AM-PAC PT "6 Clicks" Mobility   Outcome Measure  Help needed turning from your back to your side while in a flat bed without using bedrails?: None Help needed moving from lying on your back to sitting on the side of a flat bed without using bedrails?: A Little Help needed moving to and from a bed to a chair (including a wheelchair)?: A Little Help needed standing up from a chair using your arms (e.g., wheelchair or bedside chair)?: A Little Help needed to walk in hospital room?: A Little Help needed climbing 3-5 steps with a railing? : A Little 6 Click Score: 19    End of Session Equipment Utilized During Treatment: Gait belt Activity Tolerance: Patient tolerated treatment well Patient left: with call bell/phone within reach;in bed;in chair;with chair alarm set Nurse  Communication: Mobility status PT Visit Diagnosis: Difficulty in walking, not elsewhere classified (R26.2)     Time: 1610-9604 PT Time Calculation (min) (ACUTE ONLY): 21 min  Charges:    $Gait Training: 8-22 mins PT General Charges $$ ACUTE PT VISIT: 1 Visit                      Jerolyn Center, PT Acute Rehabilitation Services  Office [phone]    Zena Amos 06/04/2023, 9:41 AM

## 2023-06-04 NOTE — Progress Notes (Signed)
[facility] Liaison Note  Referral received for patient/family interest in [facility]. Chart under review by [facility] physician.   Unfortunately, [facility] is unable to offer a bed today. [facility] liaison will evaluate tomorrow.   Please call with any questions or concerns. Thank you  Dionicio Stall, LCSW [facility] hospital liaison [phone]

## 2023-06-04 NOTE — Progress Notes (Signed)
   Palliative Medicine Inpatient Follow Up Note HPI: Patient is a 89 y.o.  male prior history of Bell's palsy, HTN, dementia, depression, GERD-who apparently had a "bout" of PNA in June-and was treated as an outpatient with oral antimicrobial therapy-presented with cough-upon further evaluation-he was found to have fever and acute hypoxic respiratory failure in the setting of multifocal pneumonia.  Patient was started on heated high flow oxygen in the ED and subsequently admitted to the hospitalist service.    Palliative care has been requested for additional goals of care conversations.  Today's Discussion 06/04/2023  *Please note that this is a verbal dictation therefore any spelling or grammatical errors are due to the "Dragon Medical One" system interpretation.  Chart reviewed inclusive of vital signs, progress notes, laboratory results, and diagnostic images.   I met with Brent Brennan and his daughters, Brent Brennan and Brent Brennan at bedside this afternoon.   Reviewed Brent Brennan's recurrent aspiration events and the diagnostic imaging (esophagram and MBS results). Patients daughters share preparedness for hospice. They have had friends receive hospice care and Brent Brennan's spouse is a pastor therefore she also is familiar.   We further reviewed comfort focus of care. We talked about transition to comfort measures in house and what that would entail inclusive of medications to control pain, dyspnea, agitation, nausea, itching, and hiccups.  We discussed stopping all uneccessary measures such as cardiac monitoring, blood draws, needle sticks, and frequent vital signs.   Both children agree there is no utility in present medical measures and are in agreement with strictly focusing on comfort inclusive of allowing Brent Brennan to eat and drink what he most enjoys.   We discussed Brent Brennan transitioning home with hospice versus inpatient options. Patients children both feel him going home would be too difficult therefore  would prefer [facility]   Questions and concerns addressed/Palliative Support Provided.   Objective Assessment: Vital Signs Vitals:   06/04/23 1000 06/04/23 1226  BP:  (!) 161/64  Pulse:  69  Resp:  (!) 23  Temp:  97.7 F (36.5 C)  SpO2: 94% 96%    Intake/Output Summary (Last 24 hours) at 06/04/2023 1337 Last data filed at 06/04/2023 0330 Gross per 24 hour  Intake 283.1 ml  Output --  Net 283.1 ml   Last Weight  Most recent update: 05/30/2023  1:47 PM    Weight  54 kg (119 lb)            Gen:  Frail elderly caucasian M in NAD HEENT: moist mucous membranes CV: Regular rate and rhythm  PULM: On RA, breathing nonlabored ABD: soft/nontender  EXT: No edema  Neuro: Alert and oriented   SUMMARY OF RECOMMENDATIONS   DNAR/DNI   Comfort Care  Comfort medications per [facility]  Liberalize diet so patient can enjoy milkshakes  Unrestricted visitation  TOC consult for placement at [facility]   Ongoing PMT support  Billing based on MDM: High ______________________________________________________________________________________ [name] [name] Palliative Medicine Team Team Cell Phone: [phone] Please utilize secure chat with additional questions, if there is no response within 30 minutes please call the above phone number  Palliative Medicine Team providers are available by phone from 7am to 7pm daily and can be reached through the team cell phone.  Should this patient require assistance outside of these hours, please call the patient's attending physician.

## 2023-06-04 NOTE — Progress Notes (Signed)
PROGRESS NOTE        PATIENT DETAILS Name: Brent Brennan Age: 89 y.o. Sex: male Date of Birth: [birth year] Admit Date: 05/30/2023 Admitting Physician Dewayne Shorter Levora Dredge, MD VHQ:IONG, Ravisankar, MD  Brief Summary: Patient is a 89 y.o.  male prior history of Bell's palsy, HTN, dementia, depression, GERD-who apparently had a "bout" of PNA in June-and was treated as an outpatient with oral antimicrobial therapy-presented with cough-upon further evaluation-he was found to have fever and acute hypoxic respiratory failure in the setting of multifocal pneumonia.  Patient was started on heated high flow oxygen in the ED and subsequently admitted to the hospitalist service.  Significant events: 8/16>> admit to [facility]-hypoxia-heated high flow 8/20>> MBS-profound pharyngeal phase dysphagia-n.p.o.  Significant studies: 8/16>> CXR: Bilateral pulmonary opacities 8/17>> HRCT chest: Multilobar bronchopneumonia 8/19>> barium esophagogram: Aspiration of thin barium with initial swallow for esophagogram-study aborted.  Significant microbiology data: 8/16>> COVID/influenza/RSV PCR: Negative 8/16>> respiratory virus panel: Negative 8/16>> blood cultures: Negative  Procedures: None  Consults: PCCM  Subjective: Major issues overnight-just on 2 L of oxygen.  Objective: Vitals: Blood pressure (!) 141/71, pulse 64, temperature 97.6 F (36.4 C), temperature source Oral, resp. rate (!) 24, height 5\' 10"  (1.778 m), weight 54 kg, SpO2 94%.   Exam: Awake/alert-hard of hearing Chest: Clear to auscultation Abdomen: Soft nontender Extremities: Trace edema Neurology: Nonfocal exam  Pertinent Labs/Radiology:    Latest Ref Rng & Units 06/02/2023    2:11 AM 05/31/2023    2:33 AM 05/30/2023    4:55 PM  CBC  WBC 4.0 - 10.5 K/uL 5.2  5.9  7.8   Hemoglobin 13.0 - 17.0 g/dL 29.5  28.4  13.2   Hematocrit 39.0 - 52.0 % 33.3  32.1  33.9   Platelets 150 - 400 K/uL 106  76  95      Lab Results  Component Value Date   NA 142 06/04/2023   K 3.0 (L) 06/04/2023   CL 102 06/04/2023   CO2 30 06/04/2023     Assessment/Plan: Acute hypoxic respiratory failure secondary to multifocal pneumonia Felt to be secondary to aspiration pneumonia (elderly/Bell's palsy with right facial droop/history of esophageal issues requiring dilatation)  Rapidly improved with supportive care/IV antibiotics-on initial presentation was on heated high flow-but now down to just 2 L of oxygen.  Multifactorial dysphagia Underwent extensive evaluation with barium esophagogram-modified barium swallow-it is now felt that predominant issue is severe pharyngeal phase dysphagia,-patient has probably some esophageal component given solid food dysphagia/choking sensation.  He was unable to tolerate a barium esophagogram-due to increased aspiration risk and this was aborted.  Patient has a history of esophageal dysmotility/strictures requiring numerous dilatation in the past.  Per family-GI MD had already communicated to the family that patient would not be a candidate for any further dilatation procedures. Very difficult situation-Long discussion with patient's daughter Brent Brennan this morning-family aware of difficult situation-hospice care versus PEG tube discussed, advised that hospice care would be appropriate in this setting, placing PEG tube will not prevent aspiration at this point-he will likely continue to aspirating saliva.  Family leaning towards hospice care-they have a meeting with palliative care scheduled later today-will await further recommendations.  Suspect he has had some chronic aspiration of the past several months-he also had a episode of pneumonia in June 2024 that was treated as an outpatient.  HTN Stable HCTZ  on hold  Hypokalemia Replete/recheck  GERD/esophageal stricture PPI See above-has had esophageal dilatation by Dr. Mansouraty-May 2022  Dementia with delirium Has had  intermittent delirium (family concerned that if he did have a PEG tube-this will likely be pulled out by patient) Currently awake/alert Namenda Scheduled melatonin As needed Haldol Delirium precautions.  Depression Lexapro  History of Bell's palsy with right-sided facial weakness  Palliative care See above discussion-DNR in place-palliative care to meet with family today and decide further course of action/disposition.   Underweight: Estimated body mass index is 17.07 kg/m as calculated from the following:   Height as of this encounter: 5\' 10"  (1.778 m).   Weight as of this encounter: 54 kg.   Code status:   Code Status: DNR   DVT Prophylaxis: enoxaparin (LOVENOX) injection 40 mg Start: 05/30/23 1645   Family Communication: Daughter-Brent Brennan at bedside on 8/18-none at bedside earlier this morning.   Disposition Plan: Status is: Inpatient Remains inpatient appropriate because: Severity of illness   Planned Discharge Destination:Home health   Diet: Diet Order             Diet NPO time specified  Diet effective now                     Antimicrobial agents: Anti-infectives (From admission, onward)    Start     Dose/Rate Route Frequency Ordered Stop   05/31/23 1200  Ampicillin-Sulbactam (UNASYN) 3 g in sodium chloride 0.9 % 100 mL IVPB        3 g 200 mL/hr over 30 Minutes Intravenous Every 8 hours 05/31/23 1107 06/05/23 1159   05/30/23 1645  cefTRIAXone (ROCEPHIN) 2 g in sodium chloride 0.9 % 100 mL IVPB  Status:  Discontinued        2 g 200 mL/hr over 30 Minutes Intravenous Every 24 hours 05/30/23 1644 05/31/23 1103   05/30/23 1345  azithromycin (ZITHROMAX) 500 mg in sodium chloride 0.9 % 250 mL IVPB  Status:  Discontinued        500 mg 250 mL/hr over 60 Minutes Intravenous Every 24 hours 05/30/23 1337 05/31/23 1103        MEDICATIONS: Scheduled Meds:  vitamin C  1,000 mg Oral Daily   calcium carbonate  1 tablet Oral Q breakfast   cholecalciferol   2,000 Units Oral Daily   enoxaparin (LOVENOX) injection  40 mg Subcutaneous Q24H   escitalopram  10 mg Oral Daily   guaiFENesin  600 mg Oral BID   melatonin  5 mg Oral QHS   memantine  10 mg Oral BID   mirtazapine  15 mg Oral QHS   multivitamin with minerals  1 tablet Oral Daily   pantoprazole  40 mg Oral BID   Continuous Infusions:  ampicillin-sulbactam (UNASYN) IV 3 g (06/04/23 0445)   dextrose 5% lactated ringers Stopped (06/01/23 1957)   potassium chloride 10 mEq (06/04/23 1046)   PRN Meds:.acetaminophen **OR** acetaminophen, haloperidol lactate, hydrALAZINE, levalbuterol, metoprolol tartrate, ondansetron **OR** ondansetron (ZOFRAN) IV, polyethylene glycol, traMADol   I have personally reviewed following labs and imaging studies  LABORATORY DATA: CBC: Recent Labs  Lab 05/30/23 1337 05/30/23 1655 05/31/23 0233 06/02/23 0211  WBC 10.2 7.8 5.9 5.2  NEUTROABS 8.9*  --   --   --   HGB 12.6* 10.8* 10.4* 10.5*  HCT 39.4 33.9* 32.1* 33.3*  MCV 94.3 94.7 92.2 95.1  PLT 118* 95* 76* 106*    Basic Metabolic Panel: Recent Labs  Lab 05/31/23 0233  06/01/23 0728 06/02/23 0211 06/03/23 0154 06/04/23 0054  NA 139 141 143 144 142  K 3.2* 3.1* 3.1* 3.2* 3.0*  CL 98 99 102 102 102  CO2 32 33* 32 32 30  GLUCOSE 116* 139* 118* 115* 102*  BUN 20 21 18 14 10   CREATININE 0.83 0.84 0.69 0.87 0.86  CALCIUM 8.5* 8.4* 8.4* 8.5* 8.1*  MG  --   --   --  1.9 1.8    GFR: Estimated Creatinine Clearance: 43.6 mL/min (by C-G formula based on SCr of 0.86 mg/dL).  Liver Function Tests: Recent Labs  Lab 05/30/23 1337 05/31/23 0233  AST 30 17  ALT 10 13  ALKPHOS 63 46  BILITOT 2.3* 1.1  PROT 6.0* 4.7*  ALBUMIN 3.3* 2.4*   No results for input(s): "LIPASE", "AMYLASE" in the last 168 hours. No results for input(s): "AMMONIA" in the last 168 hours.  Coagulation Profile: Recent Labs  Lab 05/30/23 1337  INR 1.6*    Cardiac Enzymes: No results for input(s): "CKTOTAL", "CKMB",  "CKMBINDEX", "TROPONINI" in the last 168 hours.  BNP (last 3 results) No results for input(s): "PROBNP" in the last 8760 hours.  Lipid Profile: No results for input(s): "CHOL", "HDL", "LDLCALC", "TRIG", "CHOLHDL", "LDLDIRECT" in the last 72 hours.  Thyroid Function Tests: No results for input(s): "TSH", "T4TOTAL", "FREET4", "T3FREE", "THYROIDAB" in the last 72 hours.  Anemia Panel: No results for input(s): "VITAMINB12", "FOLATE", "FERRITIN", "TIBC", "IRON", "RETICCTPCT" in the last 72 hours.  Urine analysis:    Component Value Date/Time   COLORURINE AMBER (A) 05/31/2023 0946   APPEARANCEUR CLEAR 05/31/2023 0946   LABSPEC 1.027 05/31/2023 0946   PHURINE 5.0 05/31/2023 0946   GLUCOSEU NEGATIVE 05/31/2023 0946   HGBUR NEGATIVE 05/31/2023 0946   BILIRUBINUR NEGATIVE 05/31/2023 0946   KETONESUR 20 (A) 05/31/2023 0946   PROTEINUR 30 (A) 05/31/2023 0946   NITRITE NEGATIVE 05/31/2023 0946   LEUKOCYTESUR NEGATIVE 05/31/2023 0946    Sepsis Labs: Lactic Acid, Venous    Component Value Date/Time   LATICACIDVEN 1.2 05/30/2023 1736    MICROBIOLOGY: Recent Results (from the past 240 hour(s))  Blood Culture (routine x 2)     Status: None   Collection Time: 05/30/23  1:37 PM   Specimen: BLOOD RIGHT FOREARM  Result Value Ref Range Status   Specimen Description BLOOD RIGHT FOREARM  Final   Special Requests   Final    BOTTLES DRAWN AEROBIC AND ANAEROBIC Blood Culture adequate volume   Culture   Final    NO GROWTH 5 DAYS Performed at [facility], [address] [place], [place] [address]    Report Status 06/04/2023 FINAL  Final  Blood Culture (routine x 2)     Status: None   Collection Time: 05/30/23  1:42 PM   Specimen: BLOOD RIGHT ARM  Result Value Ref Range Status   Specimen Description BLOOD RIGHT ARM  Final   Special Requests   Final    BOTTLES DRAWN AEROBIC AND ANAEROBIC Blood Culture adequate volume   Culture   Final    NO GROWTH 5 DAYS Performed at [facility], [address] [place], [place] [address]    Report Status 06/04/2023 FINAL  Final  Resp panel by RT-PCR (RSV, Flu A&B, Covid) Anterior Nasal Swab     Status: None   Collection Time: 05/30/23  3:18 PM   Specimen: Anterior Nasal Swab  Result Value Ref Range Status   SARS Coronavirus 2 by RT PCR  NEGATIVE NEGATIVE Final   Influenza A by PCR NEGATIVE NEGATIVE Final   Influenza B by PCR NEGATIVE NEGATIVE Final    Comment: (NOTE) The Xpert Xpress SARS-CoV-2/FLU/RSV plus assay is intended as an aid in the diagnosis of influenza from Nasopharyngeal swab specimens and should not be used as a sole basis for treatment. Nasal washings and aspirates are unacceptable for Xpert Xpress SARS-CoV-2/FLU/RSV testing.  Fact Sheet for Patients: BloggerCourse.com  Fact Sheet for Healthcare Providers: SeriousBroker.it  This test is not yet approved or cleared by the [place] FDA and has been authorized for detection and/or diagnosis of SARS-CoV-2 by FDA under an Emergency Use Authorization (EUA). This EUA will remain in effect (meaning this test can be used) for the duration of the COVID-19 declaration under Section 564(b)(1) of the Act, 21 U.S.C. section 360bbb-3(b)(1), unless the authorization is terminated or revoked.     Resp Syncytial Virus by PCR NEGATIVE NEGATIVE Final    Comment: (NOTE) Fact Sheet for Patients: BloggerCourse.com  Fact Sheet for Healthcare Providers: SeriousBroker.it  This test is not yet approved or cleared by the [place] FDA and has been authorized for detection and/or diagnosis of SARS-CoV-2 by FDA under an Emergency Use Authorization (EUA). This EUA will remain in effect (meaning this test can be used) for the duration of the COVID-19 declaration under Section 564(b)(1) of the Act, 21 U.S.C. section 360bbb-3(b)(1), unless the authorization is  terminated or revoked.  Performed at [facility], [address] [place], [place] [address]   Respiratory (~20 pathogens) panel by PCR     Status: None   Collection Time: 05/30/23  3:39 PM   Specimen: Nasopharyngeal Swab; Respiratory  Result Value Ref Range Status   Adenovirus NOT DETECTED NOT DETECTED Final   Coronavirus 229E NOT DETECTED NOT DETECTED Final    Comment: (NOTE) The Coronavirus on the Respiratory Panel, DOES NOT test for the novel  Coronavirus (2019 nCoV)    Coronavirus HKU1 NOT DETECTED NOT DETECTED Final   Coronavirus NL63 NOT DETECTED NOT DETECTED Final   Coronavirus OC43 NOT DETECTED NOT DETECTED Final   Metapneumovirus NOT DETECTED NOT DETECTED Final   Rhinovirus / Enterovirus NOT DETECTED NOT DETECTED Final   Influenza A NOT DETECTED NOT DETECTED Final   Influenza B NOT DETECTED NOT DETECTED Final   Parainfluenza Virus 1 NOT DETECTED NOT DETECTED Final   Parainfluenza Virus 2 NOT DETECTED NOT DETECTED Final   Parainfluenza Virus 3 NOT DETECTED NOT DETECTED Final   Parainfluenza Virus 4 NOT DETECTED NOT DETECTED Final   Respiratory Syncytial Virus NOT DETECTED NOT DETECTED Final   Bordetella pertussis NOT DETECTED NOT DETECTED Final   Bordetella Parapertussis NOT DETECTED NOT DETECTED Final   Chlamydophila pneumoniae NOT DETECTED NOT DETECTED Final   Mycoplasma pneumoniae NOT DETECTED NOT DETECTED Final    Comment: Performed at [facility], [address] [place], [place] [address]    RADIOLOGY STUDIES/RESULTS: DG Swallowing Func-Speech Pathology  Result Date: 06/03/2023 Blaine Hamper     06/03/2023  5:50 PM Modified Barium Swallow Study Patient Details Name: EAMES CERROS MRN: [id number] Date of Birth: 04-10-1931 Today's Date: 06/03/2023 HPI/PMH: HPI: BURDELL POMPER is a 89 yo male presenting to ED 8/16 with increased disorientation and a dry cough. Presented to PCP with hypoxia and CXR performed there revealed PNA and was  sent to ED. Per note, pt had O2 saturations in the low 80s upon presentation to ED and was placed on  a NRB with continued desats and was then started on HHFNC. CXR 8/17 with increased bilateral pulmonary opacities compatible with multifocal PNA. Found to be febrile with acute hypoxic respiratory failure. EGD completed 8/19 with aspiration of thin barium and SLP evaluation recommended. 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 Clinical Impression: Clinical Impression: Patient presents with a profound pharyngeal phase dysphagia as per this MBS. Prior to any boluses given, barium contrast observed in nasopharynx and adhered to posterior pharyngeal wall, presumably from esophagram that was performed previous date  (06/02/23) at 1213. Patient's oral mucosa was very dry with secretions observed mainly on hard and soft palate and oropharynx. Cough was ineffective to clear any secretions or PO residuals. SLP administered one small teaspoon sip of thin liquid barium which resulted in penetration above the vocal cords (PAS 3). Patient did exhibit laryngeal elevation during swallow but no anterior hyoid movement and no epiglottic inversion. Tested bolus of barium as well as barium that was present prior to this MBS all remained in nasopharynx and pharynx. No PES opening observed and no barium transiting through PES. SLP recommending continue NPO status with prognosis to safely have PO's being guarded. Plan for ice chip trials as patient likely has significant amount of dried secretions in pharynx as well. Factors that may increase risk of adverse event in presence of aspiration [name] & Clearance Coots 2021): Factors that may increase risk of adverse event in presence of aspiration [name] & Clearance Coots 2021): Poor general health and/or compromised immunity; Reduced cognitive function; Limited mobility; Weak cough; Inadequate oral hygiene; Frail or deconditioned Recommendations/Plan: Swallowing  Evaluation Recommendations Swallowing Evaluation Recommendations Recommendations: NPO Medication Administration: Via alternative means Recommended consults: Consider Palliative care Treatment Plan Treatment Plan Treatment recommendations: Therapy as outlined in treatment plan below Follow-up recommendations: Follow physicians's recommendations for discharge plan and follow up therapies Functional status assessment: Patient has had a recent decline in their functional status and demonstrates the ability to make significant improvements in function in a reasonable and predictable amount of time. Treatment frequency: Min 2x/week Treatment duration: 1 week Interventions: Aspiration precaution training; Trials of upgraded texture/liquids Recommendations Recommendations for follow up therapy are one component of a multi-disciplinary discharge planning process, led by the attending physician.  Recommendations may be updated based on patient status, additional functional criteria and insurance authorization. Assessment: Orofacial Exam: Orofacial Exam Oral Cavity: Oral Hygiene: Xerostomia; Dried secretions Oral Cavity - Dentition: Missing dentition Orofacial Anatomy: WFL Anatomy: Anatomy: Other (Comment) Boluses Administered: Boluses Administered Boluses Administered: Thin liquids (Level 0)  Oral Impairment Domain: Oral Impairment Domain Lip Closure: No labial escape Tongue control during bolus hold: Not tested Bolus transport/lingual motion: Slow tongue motion Oral residue: Trace residue lining oral structures Location of oral residue : Palate Initiation of pharyngeal swallow : Valleculae  Pharyngeal Impairment Domain: Pharyngeal Impairment Domain Soft palate elevation: -- (barium from esophagram completed 06/02/23 remained in nasopharynx and adhered to posterior pharyngeal wall) Laryngeal elevation: Complete superior movement of thyroid cartilage with complete approximation of arytenoids to epiglottic petiole Anterior hyoid  excursion: Partial anterior movement Epiglottic movement: No inversion Laryngeal vestibule closure: None, wide column air/contrast in laryngeal vestibule Pharyngeal stripping wave : Absent Pharyngeal contraction (A/P view only): N/A Pharyngoesophageal segment opening: No distension with total obstruction of flow Tongue base retraction: Narrow column of contrast or air between tongue base and PPW Pharyngeal residue: Minimal to no pharyngeal clearance Location of pharyngeal residue: Pharyngeal wall; Valleculae; Diffuse (>3 areas); Pyriform sinuses; Tongue base  Esophageal Impairment Domain: Esophageal Impairment Domain Esophageal clearance upright position: Minimal to no esophageal clearance Pill: No data recorded Penetration/Aspiration Scale Score: Penetration/Aspiration Scale Score 3.  Material enters airway, remains ABOVE vocal cords and not ejected out: Thin liquids (Level 0) Compensatory Strategies: Compensatory Strategies Compensatory strategies: Yes Effortful swallow: Ineffective Ineffective Effortful Swallow: Thin liquid (Level 0) Liquid wash: Ineffective Ineffective Liquid Wash: Thin liquid (Level 0)   General Information: Caregiver present: No  Diet Prior to this Study: NPO   Temperature : Normal   Respiratory Status: WFL   Supplemental O2: None (Room air)   History of Recent Intubation: No  Behavior/Cognition: Alert; Cooperative; Pleasant mood Self-Feeding Abilities: Dependent for feeding Baseline vocal quality/speech: Hypophonia/low volume Volitional Cough: Able to elicit Volitional Swallow: Able to elicit Exam Limitations: Other (comment) (limited to one bolus of thin liquids due to no pharyngeal clearance) Goal Planning: Prognosis for improved oropharyngeal function: Guarded Barriers to Reach Goals: Cognitive deficits; Severity of deficits No data recorded Patient/Family Stated Goal: swallow function severely impaired Consulted and agree with results and recommendations: Pt unable/family or caregiver not  available Pain: Pain Assessment Pain Assessment: No/denies pain End of Session: Start Time:SLP Start Time (ACUTE ONLY): 1120 Stop Time: SLP Stop Time (ACUTE ONLY): 1135 Time Calculation:SLP Time Calculation (min) (ACUTE ONLY): 15 min Charges: SLP Evaluations $ SLP Speech Visit: 1 Visit SLP Evaluations $MBS Swallow: 1 Procedure $Swallowing Treatment: 1 Procedure SLP visit diagnosis: SLP Visit Diagnosis: Dysphagia, unspecified (R13.10) Past Medical History: Past Medical History: Diagnosis Date  Accidental fall from ladder   resulting in multiple L rib fracturs, traumatic small left hemopneumothorax, left transverse process fracture at T4, T5, and T8 with four-day hospitalization coupled K. by mild from cytopenia and mild hyperglycemia  Arthritis   bilateral hands  Colon polyps   Dementia (HCC)   Diarrhea   IBS, Dr Kinnie Scales- suspect malabsorption/maldigestion due to lactose intolerance give the amount of milk products consumed  Diverticulitis   GERD (gastroesophageal reflux disease)   Gilbert's syndrome   Hiatal hernia   History of rib fracture 2015  fell from ladder  Hypertension   Left inguinal hernia 11/09/2018  Paralysis (HCC)   right facial paralysis Past Surgical History: Past Surgical History: Procedure Laterality Date  ADJACENT TISSUE TRANSFER/TISSUE REARRANGEMENT Right 07/08/2022  Procedure: ADJACENT TISSUE TRANSFER/LOCAL TISSUE REARRANGEMENT OF RIGHT FACE;  Surgeon: Janne Napoleon, MD;  Location: [facility] OR;  Service: Plastics;  Laterality: Right;  2 hours  CATARACT EXTRACTION, BILATERAL    CHOLECYSTECTOMY    COLONOSCOPY    esophagus stretch  02/13/2021  EYE SURGERY    HERNIA REPAIR    Right inguinal  INGUINAL HERNIA REPAIR Left 11/09/2018  Procedure: OPEN REPAIR LEFT INGUINAL HERNIA WITH MESH ERAS PATHWAY;  Surgeon: Claud Kelp, MD;  Location[facility] OR;  Service: General;  Laterality: Left;  TAP BLOCK  TONSILLECTOMY    as a child  UPPER GASTROINTESTINAL ENDOSCOPY    UPPER GASTROINTESTINAL ENDOSCOPY  02/13/2021  Angela Nevin, MA, CCC-SLP Speech Therapy     LOS: 5 days   Jeoffrey Massed, MD  [facility]    To contact the attending provider between 7A-7P or the covering provider during after hours 7P-7A, please log into the web site [email or web address] and access using universal [place] password for that web site. If you do not have the password, please call the hospital operator.  06/04/2023, 11:21 AM

## 2023-06-04 NOTE — Plan of Care (Signed)

## 2023-06-04 NOTE — Discharge Summary (Signed)
PATIENT DETAILS Name: Brent Brennan Age: 89 y.o. Sex: male Date of Birth: 1931-03-29 MRN: [id number]. Admitting Physician: Maretta Bees, MD NUU:VOZD, Ravisankar, MD  Admit Date: 05/30/2023 Discharge date: 06/05/2023  Recommendations for Outpatient Follow-up:  Optimize comfort measures  Admitted From:  Home  Disposition: Hospice care   Discharge Condition: poor  CODE STATUS:   Code Status: DNR   Diet recommendation:  Diet Order             Diet regular Room service appropriate? Yes; Fluid consistency: Thin  Diet effective now           Diet general                    Brief Summary: Patient is a 89 y.o.  male prior history of Bell's palsy, HTN, dementia, depression, GERD-who apparently had a "bout" of PNA in June-and was treated as an outpatient with oral antimicrobial therapy-presented with cough-upon further evaluation-he was found to have fever and acute hypoxic respiratory failure in the setting of multifocal pneumonia.  Patient was started on heated high flow oxygen in the ED and subsequently admitted to the hospitalist service.   Significant events: 8/16>> admit to [facility]-hypoxia-heated high flow 8/20>> MBS-profound pharyngeal phase dysphagia-n.p.o.   Significant studies: 8/16>> CXR: Bilateral pulmonary opacities 8/17>> HRCT chest: Multilobar bronchopneumonia 8/19>> barium esophagogram: Aspiration of thin barium with initial swallow for esophagogram-study aborted.   Significant microbiology data: 8/16>> COVID/influenza/RSV PCR: Negative 8/16>> respiratory virus panel: Negative 8/16>> blood cultures: Negative   Procedures: None   Consults  Brief Hospital Course: Acute hypoxic respiratory failure secondary to multifocal pneumonia Felt to be secondary to aspiration pneumonia (elderly/Bell's palsy with right facial droop/history of esophageal issues requiring dilatation)  Rapidly improved with supportive care/IV antibiotics/n.p.o. status-on  initial presentation was on heated high flow-but now down to just 2 L of oxygen. Unfortunately it appears that he has severe pharyngeal phase dysphagia-not felt safe for diet per SLP-after extensive discussion with this MD and by the palliative care team-family has decided to transition to full comfort measures.  He has been n.p.o. for almost 3-4 days-very high risk for aspiration-his life expectancy is limited and likely to be less than 2 weeks.   Multifactorial dysphagia Underwent extensive evaluation with barium esophagogram-modified barium swallow-it is now felt that predominant issue is severe pharyngeal phase dysphagia,-patient has probably some esophageal component given solid food dysphagia/choking sensation.  He was unable to tolerate a barium esophagogram-due to increased aspiration risk and this was aborted.  Patient has a history of esophageal dysmotility/strictures requiring numerous dilatation in the past.  Per family-GI MD had already communicated to the family that patient would not be a candidate for any further dilatation procedures.  Very difficult situation-Long discussion with patient's daughter Marcelino Duster on 8/21 morning by this MD-different options including hospice care versus PEG tube discussed, family had another discussion with palliative care this morning-plans are now to transition to full comfort measures.   Note-has had extensive SLP evaluation-recommendations are for n.p.o.-he has been n.p.o. for the past several days-he is at very high risk for aspiration given MBS findings.  He will be on comfort feedings   Suspect he has had some chronic aspiration of the past several months-he also had a episode of pneumonia in June 2024 that was treated as an outpatient.   HTN Stable HCTZ on hold   Hypokalemia Was repleted   GERD/esophageal stricture No role for PPI. See above-has had esophageal dilatation by Dr.  Mansouraty-May 2022   Dementia with delirium Has had  intermittent delirium throughout this hospitalization-as needed Haldol Delirium precautions.  No role for Namenda at this point.    Depression No longer on Lexapro   History of Bell's palsy with right-sided facial weakness   Palliative care Was made DNR during the early part of his hospitalization After extensive discussion over the past several days-given his severe pharyngeal phase dysphagia-family has decided to transition to full comfort measures.   Underweight: Estimated body mass index is 17.07 kg/m as calculated from the following:   Height as of this encounter: 5\' 10"  (1.778 m).   Weight as of this encounter: 54 kg.    Discharge Diagnoses:  Principal Problem:   Acute hypoxemic respiratory failure (HCC) Active Problems:   Gastroesophageal reflux disease   HTN (hypertension)   Dementia (HCC)   PNA (pneumonia)   Discharge Instructions:  Activity:  As tolerated with Full fall precautions use walker/cane & assistance as needed  Discharge Instructions     Diet general   Complete by: As directed    Discharge instructions   Complete by: As directed    Follow with Primary MD  Avva, Ravisankar, MD in 1-2 weeks  Please get a complete blood count and chemistry panel checked by your Primary MD at your next visit, and again as instructed by your Primary MD.  Get Medicines reviewed and adjusted: Please take all your medications with you for your next visit with your Primary MD  Laboratory/radiological data: Please request your Primary MD to go over all hospital tests and procedure/radiological results at the follow up, please ask your Primary MD to get all Hospital records sent to his/her office.  In some cases, they will be blood work, cultures and biopsy results pending at the time of your discharge. Please request that your primary care M.D. follows up on these results.  Also Note the following: If you experience worsening of your admission symptoms, develop shortness  of breath, life threatening emergency, suicidal or homicidal thoughts you must seek medical attention immediately by calling 911 or calling your MD immediately  if symptoms less severe.  You must read complete instructions/literature along with all the possible adverse reactions/side effects for all the Medicines you take and that have been prescribed to you. Take any new Medicines after you have completely understood and accpet all the possible adverse reactions/side effects.   Do not drive when taking Pain medications or sleeping medications (Benzodaizepines)  Do not take more than prescribed Pain, Sleep and Anxiety Medications. It is not advisable to combine anxiety,sleep and pain medications without talking with your primary care practitioner  Special Instructions: If you have smoked or chewed Tobacco  in the last 2 yrs please stop smoking, stop any regular Alcohol  and or any Recreational drug use.  Wear Seat belts while driving.  Please note: You were cared for by a hospitalist during your hospital stay. Once you are discharged, your primary care physician will handle any further medical issues. Please note that NO REFILLS for any discharge medications will be authorized once you are discharged, as it is imperative that you return to your primary care physician (or establish a relationship with a primary care physician if you do not have one) for your post hospital discharge needs so that they can reassess your need for medications and monitor your lab values.   Increase activity slowly   Complete by: As directed    No wound care   Complete  by: As directed       Allergies as of 06/05/2023   No Known Allergies      Medication List     STOP taking these medications    ARTIFICIAL TEAR SOLUTION OP   aspirin EC 81 MG tablet   CALCIUM PO   escitalopram 10 MG tablet Commonly known as: LEXAPRO   hydrochlorothiazide 25 MG tablet Commonly known as: HYDRODIURIL   memantine 10 MG  tablet Commonly known as: NAMENDA   mirtazapine 15 MG tablet Commonly known as: REMERON   multivitamin with minerals Tabs tablet   omeprazole 20 MG capsule Commonly known as: PRILOSEC   Potassium 99 MG Tabs   PRESERVISION AREDS 2 PO   Refresh Liquigel 1 % Gel Generic drug: Carboxymethylcellulose Sodium   VITAMIN C PO   VITAMIN D-3 PO       TAKE these medications    acetaminophen 325 MG tablet Commonly known as: TYLENOL Take 2 tablets (650 mg total) by mouth every 6 (six) hours as needed for mild pain (or Fever >/= 101).   glycopyrrolate 0.2 MG/ML injection Commonly known as: ROBINUL Inject 1 mL (0.2 mg total) into the skin every 4 (four) hours as needed (excessive secretions).   HYDROmorphone 1 MG/ML injection Commonly known as: DILAUDID Inject 0.5-1 mLs (0.5-1 mg total) into the vein every hour as needed (Pain/Shortness of breath).   LORazepam 2 MG/ML concentrated solution Commonly known as: ATIVAN Place 0.5 mLs (1 mg total) under the tongue every 4 (four) hours as needed for anxiety.        No Known Allergies   Other Procedures/Studies: DG Swallowing Func-Speech Pathology  Result Date: 06/03/2023 Blaine Hamper     06/03/2023  5:50 PM Modified Barium Swallow Study Patient Details Name: AAZIM FIGG MRN: [id number] Date of Birth: 1931-06-06 Today's Date: 06/03/2023 HPI/PMH: HPI: BATUHAN ROLLER is a 89 yo male presenting to ED 8/16 with increased disorientation and a dry cough. Presented to PCP with hypoxia and CXR performed there revealed PNA and was sent to ED. Per note, pt had O2 saturations in the low 80s upon presentation to ED and was placed on a NRB with continued desats and was then started on HHFNC. CXR 8/17 with increased bilateral pulmonary opacities compatible with multifocal PNA. Found to be febrile with acute hypoxic respiratory failure. EGD completed 8/19 with aspiration of thin barium and SLP evaluation recommended. PMH includes  HTN, dementia, depression, GERD, facial droop due to Bells palsy Clinical Impression: Clinical Impression: Patient presents with a profound pharyngeal phase dysphagia as per this MBS. Prior to any boluses given, barium contrast observed in nasopharynx and adhered to posterior pharyngeal wall, presumably from esophagram that was performed previous date  (06/02/23) at 1213. Patient's oral mucosa was very dry with secretions observed mainly on hard and soft palate and oropharynx. Cough was ineffective to clear any secretions or PO residuals. SLP administered one small teaspoon sip of thin liquid barium which resulted in penetration above the vocal cords (PAS 3). Patient did exhibit laryngeal elevation during swallow but no anterior hyoid movement and no epiglottic inversion. Tested bolus of barium as well as barium that was present prior to this MBS all remained in nasopharynx and pharynx. No PES opening observed and no barium transiting through PES. SLP recommending continue NPO status with prognosis to safely have PO's being guarded. Plan for ice chip trials as patient likely has significant amount of dried secretions in pharynx as well. Factors that may  increase risk of adverse event in presence of aspiration [name] & Clearance Coots 2021): Factors that may increase risk of adverse event in presence of aspiration [name] & Clearance Coots 2021): Poor general health and/or compromised immunity; Reduced cognitive function; Limited mobility; Weak cough; Inadequate oral hygiene; Frail or deconditioned Recommendations/Plan: Swallowing Evaluation Recommendations Swallowing Evaluation Recommendations Recommendations: NPO Medication Administration: Via alternative means Recommended consults: Consider Palliative care Treatment Plan Treatment Plan Treatment recommendations: Therapy as outlined in treatment plan below Follow-up recommendations: Follow physicians's recommendations for discharge plan and follow up therapies Functional status  assessment: Patient has had a recent decline in their functional status and demonstrates the ability to make significant improvements in function in a reasonable and predictable amount of time. Treatment frequency: Min 2x/week Treatment duration: 1 week Interventions: Aspiration precaution training; Trials of upgraded texture/liquids Recommendations Recommendations for follow up therapy are one component of a multi-disciplinary discharge planning process, led by the attending physician.  Recommendations may be updated based on patient status, additional functional criteria and insurance authorization. Assessment: Orofacial Exam: Orofacial Exam Oral Cavity: Oral Hygiene: Xerostomia; Dried secretions Oral Cavity - Dentition: Missing dentition Orofacial Anatomy: WFL Anatomy: Anatomy: Other (Comment) Boluses Administered: Boluses Administered Boluses Administered: Thin liquids (Level 0)  Oral Impairment Domain: Oral Impairment Domain Lip Closure: No labial escape Tongue control during bolus hold: Not tested Bolus transport/lingual motion: Slow tongue motion Oral residue: Trace residue lining oral structures Location of oral residue : Palate Initiation of pharyngeal swallow : Valleculae  Pharyngeal Impairment Domain: Pharyngeal Impairment Domain Soft palate elevation: -- (barium from esophagram completed 06/02/23 remained in nasopharynx and adhered to posterior pharyngeal wall) Laryngeal elevation: Complete superior movement of thyroid cartilage with complete approximation of arytenoids to epiglottic petiole Anterior hyoid excursion: Partial anterior movement Epiglottic movement: No inversion Laryngeal vestibule closure: None, wide column air/contrast in laryngeal vestibule Pharyngeal stripping wave : Absent Pharyngeal contraction (A/P view only): N/A Pharyngoesophageal segment opening: No distension with total obstruction of flow Tongue base retraction: Narrow column of contrast or air between tongue base and PPW  Pharyngeal residue: Minimal to no pharyngeal clearance Location of pharyngeal residue: Pharyngeal wall; Valleculae; Diffuse (>3 areas); Pyriform sinuses; Tongue base  Esophageal Impairment Domain: Esophageal Impairment Domain Esophageal clearance upright position: Minimal to no esophageal clearance Pill: No data recorded Penetration/Aspiration Scale Score: Penetration/Aspiration Scale Score 3.  Material enters airway, remains ABOVE vocal cords and not ejected out: Thin liquids (Level 0) Compensatory Strategies: Compensatory Strategies Compensatory strategies: Yes Effortful swallow: Ineffective Ineffective Effortful Swallow: Thin liquid (Level 0) Liquid wash: Ineffective Ineffective Liquid Wash: Thin liquid (Level 0)   General Information: Caregiver present: No  Diet Prior to this Study: NPO   Temperature : Normal   Respiratory Status: WFL   Supplemental O2: None (Room air)   History of Recent Intubation: No  Behavior/Cognition: Alert; Cooperative; Pleasant mood Self-Feeding Abilities: Dependent for feeding Baseline vocal quality/speech: Hypophonia/low volume Volitional Cough: Able to elicit Volitional Swallow: Able to elicit Exam Limitations: Other (comment) (limited to one bolus of thin liquids due to no pharyngeal clearance) Goal Planning: Prognosis for improved oropharyngeal function: Guarded Barriers to Reach Goals: Cognitive deficits; Severity of deficits No data recorded Patient/Family Stated Goal: swallow function severely impaired Consulted and agree with results and recommendations: Pt unable/family or caregiver not available Pain: Pain Assessment Pain Assessment: No/denies pain End of Session: Start Time:SLP Start Time (ACUTE ONLY): 1120 Stop Time: SLP Stop Time (ACUTE ONLY): 1135 Time Calculation:SLP Time Calculation (min) (ACUTE ONLY): 15 min Charges: SLP Evaluations $  SLP Speech Visit: 1 Visit SLP Evaluations $MBS Swallow: 1 Procedure $Swallowing Treatment: 1 Procedure SLP visit diagnosis: SLP Visit  Diagnosis: Dysphagia, unspecified (R13.10) Past Medical History: Past Medical History: Diagnosis Date  Accidental fall from ladder   resulting in multiple L rib fracturs, traumatic small left hemopneumothorax, left transverse process fracture at T4, T5, and T8 with four-day hospitalization coupled K. by mild from cytopenia and mild hyperglycemia  Arthritis   bilateral hands  Colon polyps   Dementia (HCC)   Diarrhea   IBS, Dr Kinnie Scales- suspect malabsorption/maldigestion due to lactose intolerance give the amount of milk products consumed  Diverticulitis   GERD (gastroesophageal reflux disease)   Gilbert's syndrome   Hiatal hernia   History of rib fracture 2015  fell from ladder  Hypertension   Left inguinal hernia 11/09/2018  Paralysis (HCC)   right facial paralysis Past Surgical History: Past Surgical History: Procedure Laterality Date  ADJACENT TISSUE TRANSFER/TISSUE REARRANGEMENT Right 07/08/2022  Procedure: ADJACENT TISSUE TRANSFER/LOCAL TISSUE REARRANGEMENT OF RIGHT FACE;  Surgeon: Janne Napoleon, MD;  Location: [facility] OR;  Service: Plastics;  Laterality: Right;  2 hours  CATARACT EXTRACTION, BILATERAL    CHOLECYSTECTOMY    COLONOSCOPY    esophagus stretch  02/13/2021  EYE SURGERY    HERNIA REPAIR    Right inguinal  INGUINAL HERNIA REPAIR Left 11/09/2018  Procedure: OPEN REPAIR LEFT INGUINAL HERNIA WITH MESH ERAS PATHWAY;  Surgeon: Claud Kelp, MD;  Location: [facility];  Service: General;  Laterality: Left;  TAP BLOCK  TONSILLECTOMY    as a child  UPPER GASTROINTESTINAL ENDOSCOPY    UPPER GASTROINTESTINAL ENDOSCOPY  02/13/2021 Angela Nevin, MA, CCC-SLP Speech Therapy   DG ESOPHAGUS W SINGLE CM (SOL OR THIN BA)  Result Date: 06/02/2023 CLINICAL DATA:  Dysphagia. History of Bell's palsy. History of recurrent pneumonia. Concern for aspiration. Request for esophagram. EXAM: ESOPHAGUS/BARIUM SWALLOW/TABLET STUDY TECHNIQUE: Limited single contrast examination was performed using thin liquid barium. This exam was  performed by Brayton El PA-C , and was supervised and interpreted by Dr. Mauri Reading Mir. FLUOROSCOPY: Radiation Exposure Index (as provided by the fluoroscopic device): 7.2 mGy Kerma COMPARISON:  None Available. FINDINGS: Swallowing: Patient could not drink through a straw. Contrast had to be injected into patient's mouth via syringe. Poor swallowing cues and mechanism. Prompt aspiration into the proximal trachea with initial swallow. Also poor cough response. Procedure terminated due to aspiration. Esophagus: Limited evaluation of the esophagus. However small volume contrast does reach the stomach. IMPRESSION: 1. Aspiration of thin barium with initial swallow for esophagram. 2. Recommend formal speech therapy evaluation. Electronically Signed   By: Acquanetta Belling M.D.   On: 06/02/2023 12:13   CT Chest High Resolution  Result Date: 05/31/2023 CLINICAL DATA:  Inpatient. Dyspnea and cough. Respiratory illness. Abnormal chest radiograph. EXAM: CT CHEST WITHOUT CONTRAST TECHNIQUE: Multidetector CT imaging of the chest was performed following the standard protocol without intravenous contrast. High resolution imaging of the lungs, as well as inspiratory and expiratory imaging, was performed. RADIATION DOSE REDUCTION: This exam was performed according to the departmental dose-optimization program which includes automated exposure control, adjustment of the mA and/or kV according to patient size and/or use of iterative reconstruction technique. COMPARISON:  Chest radiograph from earlier today. 06/05/2010 chest CT. FINDINGS: Cardiovascular: Normal heart size. No significant pericardial effusion/thickening. Three-vessel coronary atherosclerosis. Atherosclerotic nonaneurysmal thoracic aorta. Normal caliber pulmonary arteries. Mediastinum/Nodes: No significant thyroid nodules. Unremarkable esophagus. No pathologically enlarged axillary, mediastinal or hilar lymph nodes, noting limited sensitivity for the  detection of hilar  adenopathy on this noncontrast study. Lungs/Pleura: No pneumothorax. Small layering bilateral pleural effusions. Mild layering patchy mucoid debris in the right mainstem bronchus and bronchus intermedius. Extensive patchy peribronchovascular consolidation (occasionally vaguely nodular as in the right middle lobe on series 6/image 126) and ground-glass opacity throughout both lungs with scattered regions of prominent centrilobular ground-glass micronodularity in the lungs. Bandlike consolidation with air bronchograms and some volume loss in the posterior lower lobes bilaterally and in the inferior lingula. Mild cylindrical bronchiectasis in the inferior lingula. No significant regions of subpleural reticulation or frank honeycombing. No significant lobular air trapping or evidence of tracheobronchomalacia on the expiration sequence. No discrete lung masses. No central airway stenoses. Upper abdomen: Chronic prominent elevation of the left hemidiaphragm. Cholecystectomy. Numerous simple bilateral upper renal cysts, largest 6.7 cm on the right, for which no follow-up imaging is recommended. Marked left colonic diverticulosis. Musculoskeletal: No aggressive appearing focal osseous lesions. Moderate superior T12 vertebral compression fracture of uncertain chronicity, new since 06/05/2010 chest CT, favor subacute. Moderate thoracic spondylosis. IMPRESSION: 1. Multilobar bronchopneumonia. Extensive patchy peribronchovascular consolidation and ground-glass opacity throughout both lungs with scattered regions of prominent centrilobular ground-glass micronodularity. Bandlike consolidation with air bronchograms and some volume loss in the posterior lower lobes bilaterally and in the inferior lingula. 2. Mild cylindrical bronchiectasis in the inferior lingula. No findings to suggest underlying interstitial lung disease. 3. Small layering bilateral pleural effusions. 4. Chronic prominent elevation of the left hemidiaphragm. 5.  Three-vessel coronary atherosclerosis. 6. Marked left colonic diverticulosis. 7. Moderate superior T12 vertebral compression fracture of uncertain chronicity, new since 06/05/2010 chest CT, favor subacute, correlate with injury history and directed clinical exam. 8.  Aortic Atherosclerosis (ICD10-I70.0). Electronically Signed   By: Delbert Phenix M.D.   On: 05/31/2023 16:23   Portable chest 1 View  Result Date: 05/31/2023 CLINICAL DATA:  Shortness of breath and cough. EXAM: PORTABLE CHEST 1 VIEW COMPARISON:  03/30/2023 FINDINGS: Unchanged asymmetric elevation of the left hemidiaphragm which obscures the left side of the cardiac silhouette. Bilateral pulmonary opacities are noted within the right upper lobe, right lower lobe and right lower lung. These appear increased from the previous exam. Remote healed left posterior rib fractures identified. IMPRESSION: 1. Increased bilateral pulmonary opacities compatible with multifocal pneumonia. 2. Unchanged asymmetric elevation of the left hemidiaphragm. Electronically Signed   By: Signa Kell M.D.   On: 05/31/2023 09:43   DG Chest Port 1 View  Result Date: 05/30/2023 CLINICAL DATA:  Shortness of breath and cough for 2 days. EXAM: PORTABLE CHEST 1 VIEW COMPARISON:  06/09/2010 FINDINGS: Diminished exam detail secondary to rotational artifact. Stable cardiomediastinal contours. Unchanged asymmetric area of a shin of the left hemidiaphragm. Bilateral pulmonary opacities are noted within the left base, right mid and right lower lung. Remote healed left posterior rib fractures. IMPRESSION: Bilateral pulmonary opacities compatible with multifocal pneumonia. Electronically Signed   By: Signa Kell M.D.   On: 05/30/2023 15:05     TODAY-DAY OF DISCHARGE:  Subjective:   Venia Minks today does not feel hungry-no other major issues overnight.  Objective:   Blood pressure (!) 145/69, pulse 68, temperature (!) 97.5 F (36.4 C), resp. rate 20, height 5\' 10"   (1.778 m), weight 54 kg, SpO2 93%. No intake or output data in the 24 hours ending 06/05/23 1656  Filed Weights   05/30/23 1347  Weight: 54 kg    Exam: Awake Alert, Oriented *3, No new F.N deficits, Normal affect [place].AT,PERRAL Supple Neck,No JVD, No  cervical lymphadenopathy appriciated.  Symmetrical Chest wall movement, Good air movement bilaterally, CTAB RRR,No Gallops,Rubs or new Murmurs, No Parasternal Heave +ve B.Sounds, Abd Soft, Non tender, No organomegaly appriciated, No rebound -guarding or rigidity. No Cyanosis, Clubbing or edema, No new Rash or bruise   PERTINENT RADIOLOGIC STUDIES: No results found.   PERTINENT LAB RESULTS: CBC: No results for input(s): "WBC", "HGB", "HCT", "PLT" in the last 72 hours.  CMET CMP     Component Value Date/Time   NA 142 06/04/2023 0054   NA 139 02/20/2021 1215   K 3.0 (L) 06/04/2023 0054   CL 102 06/04/2023 0054   CO2 30 06/04/2023 0054   GLUCOSE 102 (H) 06/04/2023 0054   BUN 10 06/04/2023 0054   BUN 10 02/20/2021 1215   CREATININE 0.86 06/04/2023 0054   CALCIUM 8.1 (L) 06/04/2023 0054   PROT 4.7 (L) 05/31/2023 0233   PROT 6.5 02/20/2021 1215   ALBUMIN 2.4 (L) 05/31/2023 0233   ALBUMIN 4.3 02/20/2021 1215   AST 17 05/31/2023 0233   ALT 13 05/31/2023 0233   ALKPHOS 46 05/31/2023 0233   BILITOT 1.1 05/31/2023 0233   BILITOT 0.4 02/20/2021 1215   EGFR 88 02/20/2021 1215   GFRNONAA >60 06/04/2023 0054    GFR Estimated Creatinine Clearance: 43.6 mL/min (by C-G formula based on SCr of 0.86 mg/dL). No results for input(s): "LIPASE", "AMYLASE" in the last 72 hours. No results for input(s): "CKTOTAL", "CKMB", "CKMBINDEX", "TROPONINI" in the last 72 hours. Invalid input(s): "POCBNP" No results for input(s): "DDIMER" in the last 72 hours. No results for input(s): "HGBA1C" in the last 72 hours. No results for input(s): "CHOL", "HDL", "LDLCALC", "TRIG", "CHOLHDL", "LDLDIRECT" in the last 72 hours. No results for input(s): "TSH",  "T4TOTAL", "T3FREE", "THYROIDAB" in the last 72 hours.  Invalid input(s): "FREET3" No results for input(s): "VITAMINB12", "FOLATE", "FERRITIN", "TIBC", "IRON", "RETICCTPCT" in the last 72 hours. Coags: No results for input(s): "INR" in the last 72 hours.  Invalid input(s): "PT" Microbiology: Recent Results (from the past 240 hour(s))  Blood Culture (routine x 2)     Status: None   Collection Time: 05/30/23  1:37 PM   Specimen: BLOOD RIGHT FOREARM  Result Value Ref Range Status   Specimen Description BLOOD RIGHT FOREARM  Final   Special Requests   Final    BOTTLES DRAWN AEROBIC AND ANAEROBIC Blood Culture adequate volume   Culture   Final    NO GROWTH 5 DAYS Performed at [facility], [address], [place], [place] [address]    Report Status 06/04/2023 FINAL  Final  Blood Culture (routine x 2)     Status: None   Collection Time: 05/30/23  1:42 PM   Specimen: BLOOD RIGHT ARM  Result Value Ref Range Status   Specimen Description BLOOD RIGHT ARM  Final   Special Requests   Final    BOTTLES DRAWN AEROBIC AND ANAEROBIC Blood Culture adequate volume   Culture   Final    NO GROWTH 5 DAYS Performed at [facility], [address] [place], [place] [address]    Report Status 06/04/2023 FINAL  Final  Resp panel by RT-PCR (RSV, Flu A&B, Covid) Anterior Nasal Swab     Status: None   Collection Time: 05/30/23  3:18 PM   Specimen: Anterior Nasal Swab  Result Value Ref Range Status   SARS Coronavirus 2 by RT PCR NEGATIVE NEGATIVE Final   Influenza A by PCR NEGATIVE NEGATIVE Final   Influenza  B by PCR NEGATIVE NEGATIVE Final    Comment: (NOTE) The Xpert Xpress SARS-CoV-2/FLU/RSV plus assay is intended as an aid in the diagnosis of influenza from Nasopharyngeal swab specimens and should not be used as a sole basis for treatment. Nasal washings and aspirates are unacceptable for Xpert Xpress SARS-CoV-2/FLU/RSV testing.  Fact Sheet for  Patients: BloggerCourse.com  Fact Sheet for Healthcare Providers: SeriousBroker.it  This test is not yet approved or cleared by the [place] FDA and has been authorized for detection and/or diagnosis of SARS-CoV-2 by FDA under an Emergency Use Authorization (EUA). This EUA will remain in effect (meaning this test can be used) for the duration of the COVID-19 declaration under Section 564(b)(1) of the Act, 21 U.S.C. section 360bbb-3(b)(1), unless the authorization is terminated or revoked.     Resp Syncytial Virus by PCR NEGATIVE NEGATIVE Final    Comment: (NOTE) Fact Sheet for Patients: BloggerCourse.com  Fact Sheet for Healthcare Providers: SeriousBroker.it  This test is not yet approved or cleared by the [place] FDA and has been authorized for detection and/or diagnosis of SARS-CoV-2 by FDA under an Emergency Use Authorization (EUA). This EUA will remain in effect (meaning this test can be used) for the duration of the COVID-19 declaration under Section 564(b)(1) of the Act, 21 U.S.C. section 360bbb-3(b)(1), unless the authorization is terminated or revoked.  Performed at [facility], [address], [place], [place] [address]   Respiratory (~20 pathogens) panel by PCR     Status: None   Collection Time: 05/30/23  3:39 PM   Specimen: Nasopharyngeal Swab; Respiratory  Result Value Ref Range Status   Adenovirus NOT DETECTED NOT DETECTED Final   Coronavirus 229E NOT DETECTED NOT DETECTED Final    Comment: (NOTE) The Coronavirus on the Respiratory Panel, DOES NOT test for the novel  Coronavirus (2019 nCoV)    Coronavirus HKU1 NOT DETECTED NOT DETECTED Final   Coronavirus NL63 NOT DETECTED NOT DETECTED Final   Coronavirus OC43 NOT DETECTED NOT DETECTED Final   Metapneumovirus NOT DETECTED NOT DETECTED Final   Rhinovirus / Enterovirus NOT DETECTED NOT  DETECTED Final   Influenza A NOT DETECTED NOT DETECTED Final   Influenza B NOT DETECTED NOT DETECTED Final   Parainfluenza Virus 1 NOT DETECTED NOT DETECTED Final   Parainfluenza Virus 2 NOT DETECTED NOT DETECTED Final   Parainfluenza Virus 3 NOT DETECTED NOT DETECTED Final   Parainfluenza Virus 4 NOT DETECTED NOT DETECTED Final   Respiratory Syncytial Virus NOT DETECTED NOT DETECTED Final   Bordetella pertussis NOT DETECTED NOT DETECTED Final   Bordetella Parapertussis NOT DETECTED NOT DETECTED Final   Chlamydophila pneumoniae NOT DETECTED NOT DETECTED Final   Mycoplasma pneumoniae NOT DETECTED NOT DETECTED Final    Comment: Performed at [facility], [address], [place], [place] [address]    FURTHER DISCHARGE INSTRUCTIONS:  Get Medicines reviewed and adjusted: Please take all your medications with you for your next visit with your Primary MD  Laboratory/radiological data: Please request your Primary MD to go over all hospital tests and procedure/radiological results at the follow up, please ask your Primary MD to get all Hospital records sent to his/her office.  In some cases, they will be blood work, cultures and biopsy results pending at the time of your discharge. Please request that your primary care M.D. goes through all the records of your hospital data and follows up on these results.  Also Note the following: If you experience worsening of  your admission symptoms, develop shortness of breath, life threatening emergency, suicidal or homicidal thoughts you must seek medical attention immediately by calling 911 or calling your MD immediately  if symptoms less severe.  You must read complete instructions/literature along with all the possible adverse reactions/side effects for all the Medicines you take and that have been prescribed to you. Take any new Medicines after you have completely understood and accpet all the possible adverse reactions/side effects.   Do not  drive when taking Pain medications or sleeping medications (Benzodaizepines)  Do not take more than prescribed Pain, Sleep and Anxiety Medications. It is not advisable to combine anxiety,sleep and pain medications without talking with your primary care practitioner  Special Instructions: If you have smoked or chewed Tobacco  in the last 2 yrs please stop smoking, stop any regular Alcohol  and or any Recreational drug use.  Wear Seat belts while driving.  Please note: You were cared for by a hospitalist during your hospital stay. Once you are discharged, your primary care physician will handle any further medical issues. Please note that NO REFILLS for any discharge medications will be authorized once you are discharged, as it is imperative that you return to your primary care physician (or establish a relationship with a primary care physician if you do not have one) for your post hospital discharge needs so that they can reassess your need for medications and monitor your lab values.  Total Time spent coordinating discharge including counseling, education and face to face time equals greater than 30 minutes.  SignedJeoffrey Massed 06/05/2023 4:56 PM

## 2023-06-05 DIAGNOSIS — K219 Gastro-esophageal reflux disease without esophagitis: Secondary | ICD-10-CM | POA: Diagnosis not present

## 2023-06-05 DIAGNOSIS — F0390 Unspecified dementia without behavioral disturbance: Secondary | ICD-10-CM | POA: Diagnosis not present

## 2023-06-05 DIAGNOSIS — J9601 Acute respiratory failure with hypoxia: Secondary | ICD-10-CM | POA: Diagnosis not present

## 2023-06-05 DIAGNOSIS — I159 Secondary hypertension, unspecified: Secondary | ICD-10-CM | POA: Diagnosis not present

## 2023-06-05 DIAGNOSIS — Z515 Encounter for palliative care: Secondary | ICD-10-CM | POA: Diagnosis not present

## 2023-06-05 NOTE — TOC Progression Note (Signed)
Transition of Care [facility]) - Progression Note    Patient Details  Name: Brent Brennan MRN: [id number] Date of Birth: 13-May-1931  Transition of Care [facility]) CM/SW Contact  Etheridge Geil [place], [place] Phone Number: 06/05/2023, 9:26 AM  Clinical Narrative:      PMT updated tx team via secure chat that family would like for pt to go to [facility] facility. [facility] liaison was included in chat; they will review pt and provide update when available     Social Determinants of Health (SDOH) Interventions SDOH Screenings   Food Insecurity: Unknown (06/03/2023)  Housing: Patient Declined (06/03/2023)  Transportation Needs: Patient Declined (06/03/2023)  Utilities: Patient Declined (06/03/2023)  Tobacco Use: Medium Risk (05/30/2023)    Readmission Risk Interventions     No data to display

## 2023-06-05 NOTE — Progress Notes (Signed)
  Palliative Medicine Inpatient Follow Up Note HPI: Patient is a 89 y.o.  male prior history of Bell's palsy, HTN, dementia, depression, GERD-who apparently had a "bout" of PNA in June-and was treated as an outpatient with oral antimicrobial therapy-presented with cough-upon further evaluation-he was found to have fever and acute hypoxic respiratory failure in the setting of multifocal pneumonia.  Patient was started on heated high flow oxygen in the ED and subsequently admitted to the hospitalist service.    Palliative care has been requested for additional goals of care conversations.  Today's Discussion 06/05/2023  *Please note that this is a verbal dictation therefore any spelling or grammatical errors are due to the "Dragon Medical One" system interpretation.  Chart reviewed inclusive of vital signs, progress notes, laboratory results, and diagnostic images. Patient is comfortable and denies pain or distress. His daughter is present visiting. She confirms goal is for discharge to [facility]. Her sister is on her way to visit as well.  Questions and concerns addressed/Palliative Support Provided.   Objective Assessment: Vital Signs Vitals:   06/05/23 0400 06/05/23 0405  BP: (!) 145/69   Pulse: 65 68  Resp: 20   Temp:  (!) 97.5 F (36.4 C)  SpO2: 94% 93%   No intake or output data in the 24 hours ending 06/05/23 0839  Last Weight  Most recent update: 05/30/2023  1:47 PM    Weight  54 kg (119 lb)            Gen:  Frail elderly caucasian M in NAD HEENT: moist mucous membranes CV: Regular rate and rhythm  PULM: On RA, breathing nonlabored ABD: soft/nontender  EXT: No edema  Neuro: Alert and oriented    SUMMARY OF RECOMMENDATIONS   -DNAR/DNI -Continue Comfort Care. No adjustments to medications required today -Await bed availability for placement at [facility] -Ongoing PMT support   Billing based on MDM:  Low ______________________________________________________________________________________ Richardson Dopp, PA-C [facility] Palliative Medicine Team Team Cell Phone: [phone] Please utilize secure chat with additional questions, if there is no response within 30 minutes please call the above phone number  Palliative Medicine Team providers are available by phone from 7am to 7pm daily and can be reached through the team cell phone.  Should this patient require assistance outside of these hours, please call the patient's attending physician.

## 2023-06-05 NOTE — Progress Notes (Signed)
[facility] Liaison Note   Unfortunately, [facility] is unable to offer a bed today. [facility] liaison will evaluate tomorrow.    Please call with any questions or concerns. Thank you   Dionicio Stall, LCSW [facility] hospital liaison [phone]

## 2023-06-05 NOTE — Progress Notes (Signed)
Report called to [facility] at [facility]

## 2023-06-05 NOTE — Progress Notes (Addendum)
PROGRESS NOTE        PATIENT DETAILS Name: Brent Brennan Age: 89 y.o. Sex: male Date of Birth: 1931/09/15 Admit Date: 05/30/2023 Admitting Physician Dewayne Shorter Levora Dredge, MD WUJ:[id number], Ravisankar, MD  Brief Summary: Patient is a 89 y.o.  male prior history of Bell's palsy, HTN, dementia, depression, GERD-who apparently had a "bout" of PNA in June-and was treated as an outpatient with oral antimicrobial therapy-presented with cough-upon further evaluation-he was found to have fever and acute hypoxic respiratory failure in the setting of multifocal pneumonia.  Patient was started on heated high flow oxygen in the ED and subsequently admitted to the hospitalist service.  Significant events: 8/16>> admit to [facility]-hypoxia-heated high flow 8/20>> MBS-profound pharyngeal phase dysphagia-n.p.o. 8/21>> transition to comfort care  Significant studies: 8/16>> CXR: Bilateral pulmonary opacities 8/17>> HRCT chest: Multilobar bronchopneumonia 8/19>> barium esophagogram: Aspiration of thin barium with initial swallow for esophagogram-study aborted.  Significant microbiology data: 8/16>> COVID/influenza/RSV PCR: Negative 8/16>> respiratory virus panel: Negative 8/16>> blood cultures: Negative  Procedures: None  Consults: PCCM  Subjective: Had not eaten for the past 3-4 days-diet resumed yesterday-barely ate anything-2 scoops of ice cream and 2 spoons of mashed potatoes.  He is attempting to eat breakfast this morning but overtly aspirating.  Hardly any oral intake for 4-5 days.  Objective: Vitals: Blood pressure (!) 145/69, pulse 68, temperature (!) 97.5 F (36.4 C), resp. rate 20, height 5\' 10"  (1.778 m), weight 54 kg, SpO2 93%.   Exam: Looks frail Awake/alert No edema  Pertinent Labs/Radiology:    Latest Ref Rng & Units 06/02/2023    2:11 AM 05/31/2023    2:33 AM 05/30/2023    4:55 PM  CBC  WBC 4.0 - 10.5 K/uL 5.2  5.9  7.8   Hemoglobin 13.0 - 17.0 g/dL  14.7  82.9  56.2   Hematocrit 39.0 - 52.0 % 33.3  32.1  33.9   Platelets 150 - 400 K/uL 106  76  95     Lab Results  Component Value Date   NA 142 06/04/2023   K 3.0 (L) 06/04/2023   CL 102 06/04/2023   CO2 30 06/04/2023     Assessment/Plan: Acute hypoxic respiratory failure secondary to multifocal pneumonia Felt to be secondary to aspiration pneumonia (elderly/Bell's palsy with right facial droop/history of esophageal issues requiring dilatation)  Rapidly improved with supportive care/IV antibiotics/n.p.o. status-on initial presentation was on heated high flow-but now down to just 2 L of oxygen. Unfortunately it appears that he has severe pharyngeal phase dysphagia-not felt safe for diet per SLP-after extensive discussion with this MD and by the palliative care team-family has decided to transition to full comfort measures.  He was started on comfort feeds yesterday-unfortunately continues to have hardly any oral intake due to the fact that he is coughing and overtly aspirating-it is expected that his life expectancy is likely less than 2 weeks.     Multifactorial dysphagia Underwent extensive evaluation with barium esophagogram-modified barium swallow-it is now felt that predominant issue is severe pharyngeal phase dysphagia,-patient has probably some esophageal component given solid food dysphagia/choking sensation.  He was unable to tolerate a barium esophagogram-due to increased aspiration risk and this was aborted.  Patient has a history of esophageal dysmotility/strictures requiring numerous dilatation in the past.  Per family-GI MD had already communicated to the family that patient would not be  a candidate for any further dilatation procedures.  Very difficult situation-Long discussion with patient's daughter Marcelino Duster on 8/21 morning by this MD-different options including hospice care versus PEG tube discussed, family had another discussion with palliative care this morning-plans are now  to transition to full comfort measures.    Note-has had extensive SLP evaluation-recommendations are for n.p.o.-he has been n.p.o. for the past several days-he is at very high risk for aspiration given MBS findings.  He will be on comfort feedings-which he seems not to be tolerating well and has repeated coughing spells.  Hardly any oral intake since diet was resumed on 8/21.   Suspect he has had some chronic aspiration of the past several months-he also had a episode of pneumonia in June 2024 that was treated as an outpatient.  HTN Stable HCTZ on hold   Hypokalemia Was repleted   GERD/esophageal stricture No role for PPI. See above-has had esophageal dilatation by Dr. Mansouraty-May 2022   Dementia with delirium Has had intermittent delirium throughout this hospitalization-as needed Haldol Delirium precautions.  No role for Namenda at this point.    Depression No longer on Lexapro   History of Bell's palsy with right-sided facial weakness   Palliative care Was made DNR during the early part of his hospitalization After extensive discussion over the past several days-given his severe pharyngeal phase dysphagia-family has decided to transition to full comfort measures.    Underweight: Estimated body mass index is 17.07 kg/m as calculated from the following:   Height as of this encounter: 5\' 10"  (1.778 m).   Weight as of this encounter: 54 kg.   Code status:   Code Status: DNR   DVT Prophylaxis:    Family Communication: Daughter-at bedside.   Disposition Plan: Status is: Inpatient Remains inpatient appropriate because: Severity of illness   Planned Discharge Destination: Residential hospice   Diet: Diet Order             Diet regular Room service appropriate? Yes; Fluid consistency: Thin  Diet effective now           Diet general                     Antimicrobial agents: Anti-infectives (From admission, onward)    Start     Dose/Rate Route  Frequency Ordered Stop   05/31/23 1200  Ampicillin-Sulbactam (UNASYN) 3 g in sodium chloride 0.9 % 100 mL IVPB  Status:  Discontinued        3 g 200 mL/hr over 30 Minutes Intravenous Every 8 hours 05/31/23 1107 06/04/23 1314   05/30/23 1645  cefTRIAXone (ROCEPHIN) 2 g in sodium chloride 0.9 % 100 mL IVPB  Status:  Discontinued        2 g 200 mL/hr over 30 Minutes Intravenous Every 24 hours 05/30/23 1644 05/31/23 1103   05/30/23 1345  azithromycin (ZITHROMAX) 500 mg in sodium chloride 0.9 % 250 mL IVPB  Status:  Discontinued        500 mg 250 mL/hr over 60 Minutes Intravenous Every 24 hours 05/30/23 1337 05/31/23 1103        MEDICATIONS: Scheduled Meds:   Continuous Infusions:   PRN Meds:.acetaminophen **OR** acetaminophen, antiseptic oral rinse, glycopyrrolate **OR** glycopyrrolate **OR** glycopyrrolate, HYDROmorphone (DILAUDID) injection, levalbuterol, LORazepam **OR** LORazepam **OR** LORazepam, ondansetron **OR** ondansetron (ZOFRAN) IV, polyvinyl alcohol   I have personally reviewed following labs and imaging studies  LABORATORY DATA: CBC: Recent Labs  Lab 05/30/23 1337 05/30/23 1655 05/31/23 0233 06/02/23 0211  WBC 10.2 7.8 5.9 5.2  NEUTROABS 8.9*  --   --   --   HGB 12.6* 10.8* 10.4* 10.5*  HCT 39.4 33.9* 32.1* 33.3*  MCV 94.3 94.7 92.2 95.1  PLT 118* 95* 76* 106*    Basic Metabolic Panel: Recent Labs  Lab 05/31/23 0233 06/01/23 0728 06/02/23 0211 06/03/23 0154 06/04/23 0054  NA 139 141 143 144 142  K 3.2* 3.1* 3.1* 3.2* 3.0*  CL 98 99 102 102 102  CO2 32 33* 32 32 30  GLUCOSE 116* 139* 118* 115* 102*  BUN 20 21 18 14 10   CREATININE 0.83 0.84 0.69 0.87 0.86  CALCIUM 8.5* 8.4* 8.4* 8.5* 8.1*  MG  --   --   --  1.9 1.8    GFR: Estimated Creatinine Clearance: 43.6 mL/min (by C-G formula based on SCr of 0.86 mg/dL).  Liver Function Tests: Recent Labs  Lab 05/30/23 1337 05/31/23 0233  AST 30 17  ALT 10 13  ALKPHOS 63 46  BILITOT 2.3* 1.1   PROT 6.0* 4.7*  ALBUMIN 3.3* 2.4*   No results for input(s): "LIPASE", "AMYLASE" in the last 168 hours. No results for input(s): "AMMONIA" in the last 168 hours.  Coagulation Profile: Recent Labs  Lab 05/30/23 1337  INR 1.6*    Cardiac Enzymes: No results for input(s): "CKTOTAL", "CKMB", "CKMBINDEX", "TROPONINI" in the last 168 hours.  BNP (last 3 results) No results for input(s): "PROBNP" in the last 8760 hours.  Lipid Profile: No results for input(s): "CHOL", "HDL", "LDLCALC", "TRIG", "CHOLHDL", "LDLDIRECT" in the last 72 hours.  Thyroid Function Tests: No results for input(s): "TSH", "T4TOTAL", "FREET4", "T3FREE", "THYROIDAB" in the last 72 hours.  Anemia Panel: No results for input(s): "VITAMINB12", "FOLATE", "FERRITIN", "TIBC", "IRON", "RETICCTPCT" in the last 72 hours.  Urine analysis:    Component Value Date/Time   COLORURINE AMBER (A) 05/31/2023 0946   APPEARANCEUR CLEAR 05/31/2023 0946   LABSPEC 1.027 05/31/2023 0946   PHURINE 5.0 05/31/2023 0946   GLUCOSEU NEGATIVE 05/31/2023 0946   HGBUR NEGATIVE 05/31/2023 0946   BILIRUBINUR NEGATIVE 05/31/2023 0946   KETONESUR 20 (A) 05/31/2023 0946   PROTEINUR 30 (A) 05/31/2023 0946   NITRITE NEGATIVE 05/31/2023 0946   LEUKOCYTESUR NEGATIVE 05/31/2023 0946    Sepsis Labs: Lactic Acid, Venous    Component Value Date/Time   LATICACIDVEN 1.2 05/30/2023 1736    MICROBIOLOGY: Recent Results (from the past 240 hour(s))  Blood Culture (routine x 2)     Status: None   Collection Time: 05/30/23  1:37 PM   Specimen: BLOOD RIGHT FOREARM  Result Value Ref Range Status   Specimen Description BLOOD RIGHT FOREARM  Final   Special Requests   Final    BOTTLES DRAWN AEROBIC AND ANAEROBIC Blood Culture adequate volume   Culture   Final    NO GROWTH 5 DAYS Performed at [facility], [address], [place], [place] [address]    Report Status 06/04/2023 FINAL  Final  Blood Culture (routine x 2)     Status: None    Collection Time: 05/30/23  1:42 PM   Specimen: BLOOD RIGHT ARM  Result Value Ref Range Status   Specimen Description BLOOD RIGHT ARM  Final   Special Requests   Final    BOTTLES DRAWN AEROBIC AND ANAEROBIC Blood Culture adequate volume   Culture   Final    NO GROWTH 5 DAYS Performed at [facility], [address], [place], [place] [address]  Report Status 06/04/2023 FINAL  Final  Resp panel by RT-PCR (RSV, Flu A&B, Covid) Anterior Nasal Swab     Status: None   Collection Time: 05/30/23  3:18 PM   Specimen: Anterior Nasal Swab  Result Value Ref Range Status   SARS Coronavirus 2 by RT PCR NEGATIVE NEGATIVE Final   Influenza A by PCR NEGATIVE NEGATIVE Final   Influenza B by PCR NEGATIVE NEGATIVE Final    Comment: (NOTE) The Xpert Xpress SARS-CoV-2/FLU/RSV plus assay is intended as an aid in the diagnosis of influenza from Nasopharyngeal swab specimens and should not be used as a sole basis for treatment. Nasal washings and aspirates are unacceptable for Xpert Xpress SARS-CoV-2/FLU/RSV testing.  Fact Sheet for Patients: BloggerCourse.com  Fact Sheet for Healthcare Providers: SeriousBroker.it  This test is not yet approved or cleared by the [place] FDA and has been authorized for detection and/or diagnosis of SARS-CoV-2 by FDA under an Emergency Use Authorization (EUA). This EUA will remain in effect (meaning this test can be used) for the duration of the COVID-19 declaration under Section 564(b)(1) of the Act, 21 U.S.C. section 360bbb-3(b)(1), unless the authorization is terminated or revoked.     Resp Syncytial Virus by PCR NEGATIVE NEGATIVE Final    Comment: (NOTE) Fact Sheet for Patients: BloggerCourse.com  Fact Sheet for Healthcare Providers: SeriousBroker.it  This test is not yet approved or cleared by the [place] FDA and has been authorized for  detection and/or diagnosis of SARS-CoV-2 by FDA under an Emergency Use Authorization (EUA). This EUA will remain in effect (meaning this test can be used) for the duration of the COVID-19 declaration under Section 564(b)(1) of the Act, 21 U.S.C. section 360bbb-3(b)(1), unless the authorization is terminated or revoked.  Performed at [facility], [address], [place], [place] [address]   Respiratory (~20 pathogens) panel by PCR     Status: None   Collection Time: 05/30/23  3:39 PM   Specimen: Nasopharyngeal Swab; Respiratory  Result Value Ref Range Status   Adenovirus NOT DETECTED NOT DETECTED Final   Coronavirus 229E NOT DETECTED NOT DETECTED Final    Comment: (NOTE) The Coronavirus on the Respiratory Panel, DOES NOT test for the novel  Coronavirus (2019 nCoV)    Coronavirus HKU1 NOT DETECTED NOT DETECTED Final   Coronavirus NL63 NOT DETECTED NOT DETECTED Final   Coronavirus OC43 NOT DETECTED NOT DETECTED Final   Metapneumovirus NOT DETECTED NOT DETECTED Final   Rhinovirus / Enterovirus NOT DETECTED NOT DETECTED Final   Influenza A NOT DETECTED NOT DETECTED Final   Influenza B NOT DETECTED NOT DETECTED Final   Parainfluenza Virus 1 NOT DETECTED NOT DETECTED Final   Parainfluenza Virus 2 NOT DETECTED NOT DETECTED Final   Parainfluenza Virus 3 NOT DETECTED NOT DETECTED Final   Parainfluenza Virus 4 NOT DETECTED NOT DETECTED Final   Respiratory Syncytial Virus NOT DETECTED NOT DETECTED Final   Bordetella pertussis NOT DETECTED NOT DETECTED Final   Bordetella Parapertussis NOT DETECTED NOT DETECTED Final   Chlamydophila pneumoniae NOT DETECTED NOT DETECTED Final   Mycoplasma pneumoniae NOT DETECTED NOT DETECTED Final    Comment: Performed at [facility], [address] [place], [place] [address]    RADIOLOGY STUDIES/RESULTS: DG Swallowing Func-Speech Pathology  Result Date: 06/03/2023 Blaine Hamper     06/03/2023  5:50 PM Modified Barium Swallow Study  Patient Details Name: Brent Brennan MRN: [id number] Date of Birth: [birth year] Today's Date: 06/03/2023 HPI/PMH: HPI: Jarret  L Borgen is a 89 yo male presenting to ED 8/16 with increased disorientation and a dry cough. Presented to PCP with hypoxia and CXR performed there revealed PNA and was sent to ED. Per note, pt had O2 saturations in the low 80s upon presentation to ED and was placed on a NRB with continued desats and was then started on HHFNC. CXR 8/17 with increased bilateral pulmonary opacities compatible with multifocal PNA. Found to be febrile with acute hypoxic respiratory failure. EGD completed 8/19 with aspiration of thin barium and SLP evaluation recommended. 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 Clinical Impression: Clinical Impression: Patient presents with a profound pharyngeal phase dysphagia as per this MBS. Prior to any boluses given, barium contrast observed in nasopharynx and adhered to posterior pharyngeal wall, presumably from esophagram that was performed previous date  (06/02/23) at 1213. Patient's oral mucosa was very dry with secretions observed mainly on hard and soft palate and oropharynx. Cough was ineffective to clear any secretions or PO residuals. SLP administered one small teaspoon sip of thin liquid barium which resulted in penetration above the vocal cords (PAS 3). Patient did exhibit laryngeal elevation during swallow but no anterior hyoid movement and no epiglottic inversion. Tested bolus of barium as well as barium that was present prior to this MBS all remained in nasopharynx and pharynx. No PES opening observed and no barium transiting through PES. SLP recommending continue NPO status with prognosis to safely have PO's being guarded. Plan for ice chip trials as patient likely has significant amount of dried secretions in pharynx as well. Factors that may increase risk of adverse event in presence of aspiration [name] & Clearance Coots  2021): Factors that may increase risk of adverse event in presence of aspiration [name] & Clearance Coots 2021): Poor general health and/or compromised immunity; Reduced cognitive function; Limited mobility; Weak cough; Inadequate oral hygiene; Frail or deconditioned Recommendations/Plan: Swallowing Evaluation Recommendations Swallowing Evaluation Recommendations Recommendations: NPO Medication Administration: Via alternative means Recommended consults: Consider Palliative care Treatment Plan Treatment Plan Treatment recommendations: Therapy as outlined in treatment plan below Follow-up recommendations: Follow physicians's recommendations for discharge plan and follow up therapies Functional status assessment: Patient has had a recent decline in their functional status and demonstrates the ability to make significant improvements in function in a reasonable and predictable amount of time. Treatment frequency: Min 2x/week Treatment duration: 1 week Interventions: Aspiration precaution training; Trials of upgraded texture/liquids Recommendations Recommendations for follow up therapy are one component of a multi-disciplinary discharge planning process, led by the attending physician.  Recommendations may be updated based on patient status, additional functional criteria and insurance authorization. Assessment: Orofacial Exam: Orofacial Exam Oral Cavity: Oral Hygiene: Xerostomia; Dried secretions Oral Cavity - Dentition: Missing dentition Orofacial Anatomy: WFL Anatomy: Anatomy: Other (Comment) Boluses Administered: Boluses Administered Boluses Administered: Thin liquids (Level 0)  Oral Impairment Domain: Oral Impairment Domain Lip Closure: No labial escape Tongue control during bolus hold: Not tested Bolus transport/lingual motion: Slow tongue motion Oral residue: Trace residue lining oral structures Location of oral residue : Palate Initiation of pharyngeal swallow : Valleculae  Pharyngeal Impairment Domain: Pharyngeal  Impairment Domain Soft palate elevation: -- (barium from esophagram completed 06/02/23 remained in nasopharynx and adhered to posterior pharyngeal wall) Laryngeal elevation: Complete superior movement of thyroid cartilage with complete approximation of arytenoids to epiglottic petiole Anterior hyoid excursion: Partial anterior movement Epiglottic movement: No inversion Laryngeal vestibule closure: None, wide column air/contrast in laryngeal vestibule Pharyngeal stripping wave : Absent  Pharyngeal contraction (A/P view only): N/A Pharyngoesophageal segment opening: No distension with total obstruction of flow Tongue base retraction: Narrow column of contrast or air between tongue base and PPW Pharyngeal residue: Minimal to no pharyngeal clearance Location of pharyngeal residue: Pharyngeal wall; Valleculae; Diffuse (>3 areas); Pyriform sinuses; Tongue base  Esophageal Impairment Domain: Esophageal Impairment Domain Esophageal clearance upright position: Minimal to no esophageal clearance Pill: No data recorded Penetration/Aspiration Scale Score: Penetration/Aspiration Scale Score 3.  Material enters airway, remains ABOVE vocal cords and not ejected out: Thin liquids (Level 0) Compensatory Strategies: Compensatory Strategies Compensatory strategies: Yes Effortful swallow: Ineffective Ineffective Effortful Swallow: Thin liquid (Level 0) Liquid wash: Ineffective Ineffective Liquid Wash: Thin liquid (Level 0)   General Information: Caregiver present: No  Diet Prior to this Study: NPO   Temperature : Normal   Respiratory Status: WFL   Supplemental O2: None (Room air)   History of Recent Intubation: No  Behavior/Cognition: Alert; Cooperative; Pleasant mood Self-Feeding Abilities: Dependent for feeding Baseline vocal quality/speech: Hypophonia/low volume Volitional Cough: Able to elicit Volitional Swallow: Able to elicit Exam Limitations: Other (comment) (limited to one bolus of thin liquids due to no pharyngeal clearance)  Goal Planning: Prognosis for improved oropharyngeal function: Guarded Barriers to Reach Goals: Cognitive deficits; Severity of deficits No data recorded Patient/Family Stated Goal: swallow function severely impaired Consulted and agree with results and recommendations: Pt unable/family or caregiver not available Pain: Pain Assessment Pain Assessment: No/denies pain End of Session: Start Time:SLP Start Time (ACUTE ONLY): 1120 Stop Time: SLP Stop Time (ACUTE ONLY): 1135 Time Calculation:SLP Time Calculation (min) (ACUTE ONLY): 15 min Charges: SLP Evaluations $ SLP Speech Visit: 1 Visit SLP Evaluations $MBS Swallow: 1 Procedure $Swallowing Treatment: 1 Procedure SLP visit diagnosis: SLP Visit Diagnosis: Dysphagia, unspecified (R13.10) Past Medical History: Past Medical History: Diagnosis Date  Accidental fall from ladder   resulting in multiple L rib fracturs, traumatic small left hemopneumothorax, left transverse process fracture at T4, T5, and T8 with four-day hospitalization coupled K. by mild from cytopenia and mild hyperglycemia  Arthritis   bilateral hands  Colon polyps   Dementia (HCC)   Diarrhea   IBS, Dr Kinnie Scales- suspect malabsorption/maldigestion due to lactose intolerance give the amount of milk products consumed  Diverticulitis   GERD (gastroesophageal reflux disease)   Gilbert's syndrome   Hiatal hernia   History of rib fracture 2015  fell from ladder  Hypertension   Left inguinal hernia 11/09/2018  Paralysis (HCC)   right facial paralysis Past Surgical History: Past Surgical History: Procedure Laterality Date  ADJACENT TISSUE TRANSFER/TISSUE REARRANGEMENT Right 07/08/2022  Procedure: ADJACENT TISSUE TRANSFER/LOCAL TISSUE REARRANGEMENT OF RIGHT FACE;  Surgeon: Janne Napoleon, MD;  Location: [facility] OR;  Service: Plastics;  Laterality: Right;  2 hours  CATARACT EXTRACTION, BILATERAL    CHOLECYSTECTOMY    COLONOSCOPY    esophagus stretch  02/13/2021  EYE SURGERY    HERNIA REPAIR    Right inguinal  INGUINAL HERNIA  REPAIR Left 11/09/2018  Procedure: OPEN REPAIR LEFT INGUINAL HERNIA WITH MESH ERAS PATHWAY;  Surgeon: Claud Kelp, MD;  Location: [facility] OR;  Service: General;  Laterality: Left;  TAP BLOCK  TONSILLECTOMY    as a child  UPPER GASTROINTESTINAL ENDOSCOPY    UPPER GASTROINTESTINAL ENDOSCOPY  02/13/2021 Angela Nevin, MA, CCC-SLP Speech Therapy     LOS: 6 days   Jeoffrey Massed, MD  [facility]    To contact the attending provider between 7A-7P or the covering provider during after hours 7P-7A, please  log into the web site [email or web address] and access using universal [place] password for that web site. If you do not have the password, please call the hospital operator.  06/05/2023, 9:24 AM

## 2023-06-06 NOTE — Care Management Important Message (Signed)
Important Message  Patient Details  Name: Brent Brennan MRN: [id number] Date of Birth: Feb 25, 1931   Medicare Important Message Given:  Yes Patient left prior to IM delivery will mail a copy to the patient home address.     Pearson Picou , 4:22 PM

## 2023-06-15 DEATH — deceased
